# Patient Record
Sex: Male | Born: 1945 | Race: White | Hispanic: No | Smoking: Former smoker
Health system: Southern US, Community
[De-identification: ages and names within clinical notes are randomized; demographics above are authoritative.]

## PROBLEM LIST (undated history)

## (undated) DIAGNOSIS — K219 Gastro-esophageal reflux disease without esophagitis: Secondary | ICD-10-CM

## (undated) DIAGNOSIS — R200 Anesthesia of skin: Secondary | ICD-10-CM

## (undated) DIAGNOSIS — M1990 Unspecified osteoarthritis, unspecified site: Secondary | ICD-10-CM

## (undated) DIAGNOSIS — R202 Paresthesia of skin: Secondary | ICD-10-CM

## (undated) DIAGNOSIS — E785 Hyperlipidemia, unspecified: Secondary | ICD-10-CM

## (undated) DIAGNOSIS — E039 Hypothyroidism, unspecified: Secondary | ICD-10-CM

## (undated) DIAGNOSIS — I10 Essential (primary) hypertension: Secondary | ICD-10-CM

## (undated) DIAGNOSIS — K635 Polyp of colon: Secondary | ICD-10-CM

## (undated) HISTORY — PX: FOOT SURGERY: SHX648

## (undated) HISTORY — DX: Paresthesia of skin: R20.0

## (undated) HISTORY — PX: COLONOSCOPY: SHX174

## (undated) HISTORY — DX: Paresthesia of skin: R20.2

## (undated) HISTORY — PX: CHOLECYSTECTOMY: SHX55

---

## 1998-05-15 ENCOUNTER — Ambulatory Visit (HOSPITAL_COMMUNITY): Admission: RE | Admit: 1998-05-15 | Discharge: 1998-05-15 | Payer: Self-pay | Admitting: Family Medicine

## 1998-11-11 ENCOUNTER — Ambulatory Visit (HOSPITAL_COMMUNITY): Admission: RE | Admit: 1998-11-11 | Discharge: 1998-11-11 | Payer: Self-pay | Admitting: Family Medicine

## 2001-01-04 ENCOUNTER — Ambulatory Visit (HOSPITAL_COMMUNITY): Admission: RE | Admit: 2001-01-04 | Discharge: 2001-01-04 | Payer: Self-pay | Admitting: Gastroenterology

## 2005-03-03 ENCOUNTER — Ambulatory Visit (HOSPITAL_COMMUNITY): Admission: RE | Admit: 2005-03-03 | Discharge: 2005-03-03 | Payer: Self-pay | Admitting: Orthopedic Surgery

## 2005-03-03 ENCOUNTER — Ambulatory Visit (HOSPITAL_BASED_OUTPATIENT_CLINIC_OR_DEPARTMENT_OTHER): Admission: RE | Admit: 2005-03-03 | Discharge: 2005-03-03 | Payer: Self-pay | Admitting: Orthopedic Surgery

## 2013-09-12 DIAGNOSIS — E039 Hypothyroidism, unspecified: Secondary | ICD-10-CM | POA: Diagnosis not present

## 2013-12-04 DIAGNOSIS — H25099 Other age-related incipient cataract, unspecified eye: Secondary | ICD-10-CM | POA: Diagnosis not present

## 2014-04-08 DIAGNOSIS — I10 Essential (primary) hypertension: Secondary | ICD-10-CM | POA: Diagnosis not present

## 2014-04-08 DIAGNOSIS — K219 Gastro-esophageal reflux disease without esophagitis: Secondary | ICD-10-CM | POA: Diagnosis not present

## 2014-04-08 DIAGNOSIS — E7800 Pure hypercholesterolemia, unspecified: Secondary | ICD-10-CM | POA: Diagnosis not present

## 2014-04-08 DIAGNOSIS — Z125 Encounter for screening for malignant neoplasm of prostate: Secondary | ICD-10-CM | POA: Diagnosis not present

## 2014-04-12 ENCOUNTER — Other Ambulatory Visit: Payer: Self-pay | Admitting: Internal Medicine

## 2014-04-12 DIAGNOSIS — R5383 Other fatigue: Secondary | ICD-10-CM | POA: Diagnosis not present

## 2014-04-12 DIAGNOSIS — I10 Essential (primary) hypertension: Secondary | ICD-10-CM | POA: Diagnosis not present

## 2014-04-12 DIAGNOSIS — I6529 Occlusion and stenosis of unspecified carotid artery: Secondary | ICD-10-CM | POA: Diagnosis not present

## 2014-04-12 DIAGNOSIS — Z800 Family history of malignant neoplasm of digestive organs: Secondary | ICD-10-CM | POA: Diagnosis not present

## 2014-04-12 DIAGNOSIS — R5381 Other malaise: Secondary | ICD-10-CM | POA: Diagnosis not present

## 2014-04-12 DIAGNOSIS — Z23 Encounter for immunization: Secondary | ICD-10-CM | POA: Diagnosis not present

## 2014-04-17 ENCOUNTER — Ambulatory Visit
Admission: RE | Admit: 2014-04-17 | Discharge: 2014-04-17 | Disposition: A | Discharge status: Home / Self Care | Payer: Medicare Other | Source: Ambulatory Visit | Attending: Internal Medicine | Admitting: Internal Medicine

## 2014-04-17 DIAGNOSIS — I6529 Occlusion and stenosis of unspecified carotid artery: Secondary | ICD-10-CM | POA: Diagnosis not present

## 2014-04-17 DIAGNOSIS — I658 Occlusion and stenosis of other precerebral arteries: Secondary | ICD-10-CM | POA: Diagnosis not present

## 2014-05-21 DIAGNOSIS — G4733 Obstructive sleep apnea (adult) (pediatric): Secondary | ICD-10-CM | POA: Diagnosis not present

## 2014-05-21 DIAGNOSIS — R5381 Other malaise: Secondary | ICD-10-CM | POA: Diagnosis not present

## 2014-05-21 DIAGNOSIS — R5383 Other fatigue: Secondary | ICD-10-CM | POA: Diagnosis not present

## 2014-05-21 DIAGNOSIS — I10 Essential (primary) hypertension: Secondary | ICD-10-CM | POA: Diagnosis not present

## 2014-07-02 DIAGNOSIS — D124 Benign neoplasm of descending colon: Secondary | ICD-10-CM | POA: Diagnosis not present

## 2014-07-02 DIAGNOSIS — K641 Second degree hemorrhoids: Secondary | ICD-10-CM | POA: Diagnosis not present

## 2014-07-02 DIAGNOSIS — Z86010 Personal history of colonic polyps: Secondary | ICD-10-CM | POA: Diagnosis not present

## 2014-07-31 DIAGNOSIS — Z2089 Contact with and (suspected) exposure to other communicable diseases: Secondary | ICD-10-CM | POA: Diagnosis not present

## 2014-07-31 DIAGNOSIS — K641 Second degree hemorrhoids: Secondary | ICD-10-CM | POA: Diagnosis not present

## 2014-08-14 DIAGNOSIS — K641 Second degree hemorrhoids: Secondary | ICD-10-CM | POA: Diagnosis not present

## 2014-08-28 DIAGNOSIS — K641 Second degree hemorrhoids: Secondary | ICD-10-CM | POA: Diagnosis not present

## 2015-01-07 DIAGNOSIS — I10 Essential (primary) hypertension: Secondary | ICD-10-CM | POA: Diagnosis not present

## 2015-01-07 DIAGNOSIS — H25099 Other age-related incipient cataract, unspecified eye: Secondary | ICD-10-CM | POA: Diagnosis not present

## 2015-05-22 DIAGNOSIS — E780 Pure hypercholesterolemia: Secondary | ICD-10-CM | POA: Diagnosis not present

## 2015-05-22 DIAGNOSIS — I10 Essential (primary) hypertension: Secondary | ICD-10-CM | POA: Diagnosis not present

## 2015-05-22 DIAGNOSIS — Z125 Encounter for screening for malignant neoplasm of prostate: Secondary | ICD-10-CM | POA: Diagnosis not present

## 2015-05-26 DIAGNOSIS — Z1212 Encounter for screening for malignant neoplasm of rectum: Secondary | ICD-10-CM | POA: Diagnosis not present

## 2015-05-26 DIAGNOSIS — Z7982 Long term (current) use of aspirin: Secondary | ICD-10-CM | POA: Diagnosis not present

## 2015-05-26 DIAGNOSIS — I6529 Occlusion and stenosis of unspecified carotid artery: Secondary | ICD-10-CM | POA: Diagnosis not present

## 2015-05-26 DIAGNOSIS — I10 Essential (primary) hypertension: Secondary | ICD-10-CM | POA: Diagnosis not present

## 2015-05-26 DIAGNOSIS — E780 Pure hypercholesterolemia: Secondary | ICD-10-CM | POA: Diagnosis not present

## 2015-05-26 DIAGNOSIS — R740 Nonspecific elevation of levels of transaminase and lactic acid dehydrogenase [LDH]: Secondary | ICD-10-CM | POA: Diagnosis not present

## 2015-05-30 DIAGNOSIS — L723 Sebaceous cyst: Secondary | ICD-10-CM | POA: Diagnosis not present

## 2015-07-03 DIAGNOSIS — L723 Sebaceous cyst: Secondary | ICD-10-CM | POA: Diagnosis not present

## 2015-07-11 DIAGNOSIS — Z23 Encounter for immunization: Secondary | ICD-10-CM | POA: Diagnosis not present

## 2015-08-02 DIAGNOSIS — M25562 Pain in left knee: Secondary | ICD-10-CM | POA: Diagnosis not present

## 2016-02-06 DIAGNOSIS — H52223 Regular astigmatism, bilateral: Secondary | ICD-10-CM | POA: Diagnosis not present

## 2016-02-06 DIAGNOSIS — I10 Essential (primary) hypertension: Secondary | ICD-10-CM | POA: Diagnosis not present

## 2016-02-06 DIAGNOSIS — H5203 Hypermetropia, bilateral: Secondary | ICD-10-CM | POA: Diagnosis not present

## 2016-02-06 DIAGNOSIS — H524 Presbyopia: Secondary | ICD-10-CM | POA: Diagnosis not present

## 2016-02-06 DIAGNOSIS — H25099 Other age-related incipient cataract, unspecified eye: Secondary | ICD-10-CM | POA: Diagnosis not present

## 2016-05-20 DIAGNOSIS — Z125 Encounter for screening for malignant neoplasm of prostate: Secondary | ICD-10-CM | POA: Diagnosis not present

## 2016-05-20 DIAGNOSIS — Z0000 Encounter for general adult medical examination without abnormal findings: Secondary | ICD-10-CM | POA: Diagnosis not present

## 2016-05-20 DIAGNOSIS — Z23 Encounter for immunization: Secondary | ICD-10-CM | POA: Diagnosis not present

## 2016-05-20 DIAGNOSIS — E039 Hypothyroidism, unspecified: Secondary | ICD-10-CM | POA: Diagnosis not present

## 2016-05-20 DIAGNOSIS — I10 Essential (primary) hypertension: Secondary | ICD-10-CM | POA: Diagnosis not present

## 2016-05-20 DIAGNOSIS — Z7982 Long term (current) use of aspirin: Secondary | ICD-10-CM | POA: Diagnosis not present

## 2016-05-20 DIAGNOSIS — E7800 Pure hypercholesterolemia, unspecified: Secondary | ICD-10-CM | POA: Diagnosis not present

## 2016-05-21 ENCOUNTER — Other Ambulatory Visit: Payer: Self-pay

## 2016-05-27 DIAGNOSIS — Z800 Family history of malignant neoplasm of digestive organs: Secondary | ICD-10-CM | POA: Diagnosis not present

## 2016-05-27 DIAGNOSIS — E039 Hypothyroidism, unspecified: Secondary | ICD-10-CM | POA: Diagnosis not present

## 2016-05-27 DIAGNOSIS — Z833 Family history of diabetes mellitus: Secondary | ICD-10-CM | POA: Diagnosis not present

## 2016-05-27 DIAGNOSIS — K219 Gastro-esophageal reflux disease without esophagitis: Secondary | ICD-10-CM | POA: Diagnosis not present

## 2016-06-03 DIAGNOSIS — I6529 Occlusion and stenosis of unspecified carotid artery: Secondary | ICD-10-CM | POA: Diagnosis not present

## 2016-06-03 DIAGNOSIS — K219 Gastro-esophageal reflux disease without esophagitis: Secondary | ICD-10-CM | POA: Diagnosis not present

## 2016-06-03 DIAGNOSIS — I10 Essential (primary) hypertension: Secondary | ICD-10-CM | POA: Diagnosis not present

## 2016-06-03 DIAGNOSIS — E039 Hypothyroidism, unspecified: Secondary | ICD-10-CM | POA: Diagnosis not present

## 2016-06-03 DIAGNOSIS — E668 Other obesity: Secondary | ICD-10-CM | POA: Diagnosis not present

## 2016-06-03 DIAGNOSIS — E785 Hyperlipidemia, unspecified: Secondary | ICD-10-CM | POA: Diagnosis not present

## 2016-06-03 DIAGNOSIS — R9431 Abnormal electrocardiogram [ECG] [EKG]: Secondary | ICD-10-CM | POA: Diagnosis not present

## 2016-06-28 DIAGNOSIS — R9431 Abnormal electrocardiogram [ECG] [EKG]: Secondary | ICD-10-CM | POA: Diagnosis not present

## 2016-07-08 DIAGNOSIS — M19171 Post-traumatic osteoarthritis, right ankle and foot: Secondary | ICD-10-CM | POA: Diagnosis not present

## 2016-07-08 DIAGNOSIS — M25571 Pain in right ankle and joints of right foot: Secondary | ICD-10-CM | POA: Diagnosis not present

## 2016-07-23 DIAGNOSIS — M25571 Pain in right ankle and joints of right foot: Secondary | ICD-10-CM | POA: Diagnosis not present

## 2016-07-23 DIAGNOSIS — M19171 Post-traumatic osteoarthritis, right ankle and foot: Secondary | ICD-10-CM | POA: Diagnosis not present

## 2016-07-23 DIAGNOSIS — Z969 Presence of functional implant, unspecified: Secondary | ICD-10-CM | POA: Diagnosis not present

## 2016-07-28 ENCOUNTER — Other Ambulatory Visit: Payer: Self-pay | Admitting: Orthopedic Surgery

## 2016-07-28 DIAGNOSIS — M19171 Post-traumatic osteoarthritis, right ankle and foot: Secondary | ICD-10-CM

## 2016-07-30 ENCOUNTER — Ambulatory Visit
Admission: RE | Admit: 2016-07-30 | Discharge: 2016-07-30 | Disposition: A | Discharge status: Home / Self Care | Payer: Medicare Other | Source: Ambulatory Visit | Attending: Orthopedic Surgery | Admitting: Orthopedic Surgery

## 2016-07-30 DIAGNOSIS — M19071 Primary osteoarthritis, right ankle and foot: Secondary | ICD-10-CM | POA: Diagnosis not present

## 2016-07-30 DIAGNOSIS — M19171 Post-traumatic osteoarthritis, right ankle and foot: Secondary | ICD-10-CM

## 2016-07-30 NOTE — Discharge Instructions (Signed)
Ankle Fusion Ankle fusion surgery joins (fuses) ankle and leg bones together. The surgery uses devices such as screws, plates, rods, pins, and sometimes bone graft material. This is usually done to repair damage to the ankle due to arthritis, injury, or infection. It is also done to repair other ankle and foot conditions that cause pain. LET YOUR CAREGIVER KNOW ABOUT:   Allergies to food or medicine.  Medicines taken, including vitamins, herbs, eyedrops, over-the-counter medicines, and creams.  Use of steroids (by mouth or creams).  Previous problems with anesthetics or numbing medicines.  History of bleeding problems or blood clots.  Previous surgery.  Other health problems, including diabetes and kidney problems.  Possibility of pregnancy, if this applies. RISKS AND COMPLICATIONS  As with any surgery, complications may occur. However, they can usually be managed by your caregiver. General surgical complications may include:  Reaction to anesthesia.  Damage to surrounding nerves, tissues, or blood vessels.  Infection.  Bleeding.  Scarring.  Blood clot. With appropriate treatment and rehabilitation, the following complications are very uncommon:  Failure to heal (nonunion).  Healing in a poor position (malunion).  Stiff ankle or loss of mobility. BEFORE THE PROCEDURE  Follow your caregiver's instructions prior to your surgery to avoid complications. A physical exam and X-rays may be performed as well. You may be asked to:   Stop taking certain medicines for several days prior to your surgery, such as blood thinners (anticoagulants).  Avoid eating and drinking for at least 8 hours before the surgery. This will help you avoid complications from anesthesia.  Quit smoking, if this applies. Smoking increases the chances of a healing problem after your surgery. If you are thinking about quitting, ask your surgeon how long before the surgery you should stop smoking. Ask your  primary caregiver about approaches to help you stop. This can include medicines.  Arrange for a ride home after your surgery. Arrange for someone to help you with activities during recovery. PROCEDURE  The surgery is done after you are given medicine that makes you sleep (general anesthetic) or medicine that makes you numb from the waist down (spinal anesthetic). You will be asleep and will not feel any pain. The surgery can be performed with open surgery or minimally invasive surgery using a thin, lighted tube inserted through a small cut (arthroscopy). There are several variations of ankle fusion, but the basic surgery involves the following: Open Surgery Cuts (incisions) are made on each side of the ankle to allow the surgeon to access the joint. Once the joint is opened, cartilage and bone surfaces may be removed and reshaped if needed. The joint is repositioned in the proper place and joined together with a device, such as screws or plates. The body's natural healing process will help fuse together newly formed surfaces within the ankle. Though less common, sometimes pins within the ankle joint are attached to outside rods (external fixator) to aid in the healing process. Arthroscopic Surgery Small keyhole incisions are made on either side of the ankle. The surgeon will insert small instruments into the ankle joint. A tiny camera allows the surgeon to view inside the ankle. Small tools are used to resurface the cartilage area and bones of the ankle. Screws are placed through the skin and ankle to help it stay in position while it heals. AFTER THE PROCEDURE   You will likely be in the hospital for 1 to 2 days following surgery.  Caregivers will help you to manage pain and swelling  with medicines, ice, and raising (elevation) of the ankle.  Full recovery will take several months, but you may be able to walk gently in about 12 weeks. Crutches will help you move around when you first return  home.  A cast or splint will be applied to the lower leg. You will learn how to walk with crutches.  Follow your caregiver's instructions for home care after surgery. FOR MORE INFORMATION  [employer] Academy [employer]: www.aaos.org   This information is not intended to replace advice given to you by your health care provider. Make sure you discuss any questions you have with your health care provider.   Document Released: 03/03/2010 Document Revised: 12/06/2011 Document Reviewed: 03/03/2010 Elsevier Interactive Patient Education [employer].

## 2016-09-06 DIAGNOSIS — Z969 Presence of functional implant, unspecified: Secondary | ICD-10-CM | POA: Diagnosis not present

## 2016-09-06 DIAGNOSIS — M19171 Post-traumatic osteoarthritis, right ankle and foot: Secondary | ICD-10-CM | POA: Diagnosis not present

## 2016-09-06 DIAGNOSIS — M25571 Pain in right ankle and joints of right foot: Secondary | ICD-10-CM | POA: Diagnosis not present

## 2016-09-10 ENCOUNTER — Other Ambulatory Visit: Payer: Self-pay | Admitting: Orthopedic Surgery

## 2016-10-14 ENCOUNTER — Encounter (HOSPITAL_COMMUNITY): Payer: Self-pay

## 2016-10-14 ENCOUNTER — Encounter (HOSPITAL_COMMUNITY)
Admission: RE | Admit: 2016-10-14 | Discharge: 2016-10-14 | Disposition: A | Discharge status: Home / Self Care | Payer: Medicare Other | Source: Ambulatory Visit | Attending: Orthopedic Surgery | Admitting: Orthopedic Surgery

## 2016-10-14 DIAGNOSIS — Z01812 Encounter for preprocedural laboratory examination: Secondary | ICD-10-CM | POA: Insufficient documentation

## 2016-10-14 HISTORY — DX: Hyperlipidemia, unspecified: E78.5

## 2016-10-14 HISTORY — DX: Unspecified osteoarthritis, unspecified site: M19.90

## 2016-10-14 HISTORY — DX: Essential (primary) hypertension: I10

## 2016-10-14 HISTORY — DX: Hypothyroidism, unspecified: E03.9

## 2016-10-14 HISTORY — DX: Polyp of colon: K63.5

## 2016-10-14 HISTORY — DX: Gastro-esophageal reflux disease without esophagitis: K21.9

## 2016-10-14 LAB — CBC
HCT: 40.6 % (ref 39.0–52.0)
HEMOGLOBIN: 13.3 g/dL (ref 13.0–17.0)
MCH: 31.7 pg (ref 26.0–34.0)
MCHC: 32.8 g/dL (ref 30.0–36.0)
MCV: 96.7 fL (ref 78.0–100.0)
Platelets: 252 10*3/uL (ref 150–400)
RBC: 4.2 MIL/uL — AB (ref 4.22–5.81)
RDW: 14.1 % (ref 11.5–15.5)
WBC: 12.8 10*3/uL — AB (ref 4.0–10.5)

## 2016-10-14 LAB — BASIC METABOLIC PANEL
ANION GAP: 10 (ref 5–15)
BUN: 19 mg/dL (ref 6–20)
CHLORIDE: 102 mmol/L (ref 101–111)
CO2: 25 mmol/L (ref 22–32)
Calcium: 9.5 mg/dL (ref 8.9–10.3)
Creatinine, Ser: 0.91 mg/dL (ref 0.61–1.24)
GFR calc non Af Amer: >60 mL/min (ref >60)
Glucose, Bld: 98 mg/dL (ref 65–99)
Potassium: 3.6 mmol/L (ref 3.5–5.1)
SODIUM: 137 mmol/L (ref 135–145)

## 2016-10-14 LAB — SURGICAL PCR SCREEN
MRSA, PCR: NEGATIVE
Staphylococcus aureus: NEGATIVE

## 2016-10-14 NOTE — Pre-Procedure Instructions (Signed)
    [place]  10/14/2016    Your procedure is scheduled on Thursday, January 25.  Report to [facility] Admitting at 5:30 AM                Your surgery or procedure is scheduled for 7:15 AM   Call this number if you have problems the morning of surgery:[phone]                For any other questions, please call [phone], Monday - Friday 8 AM - 4 PM.   Remember:  Do not eat food or drink liquids after midnight Wednesday, January 24.  Take these medicines the morning of surgery with A SIP OF WATER: amLODipine (NORVASC), atorvastatin (LIPITOR), ezetimibe (ZETIA),  levothyroxine (SYNTHROID, LEVOTHROID).               1 Week prior to surgery STOP taking Aspirin, Aspirin Products (Goody Powder, Excedrin Migraine), Ibuprofen (Advil), Naproxen (Aleve), Vitamins and Herbal Products (ie Fish Oil)   Do not wear jewelry, make-up or nail polish.  Do not wear lotions, powders, or perfumes, or deodorant.   Men may shave face and neck.  Do not bring valuables to the hospital.  [facility] is not responsible for any belongings or valuables.  Contacts, dentures or bridgework may not be worn into surgery.  Leave your suitcase in the car.  After surgery it may be brought to your room.  For patients admitted to the hospital, discharge time will be determined by your treatment team.  Special instructions: Review  [place] - Preparing For Surgery.  Please read over the following fact sheets that you were given. [place]- Preparing For Surgery and Patient Instructions for Mupirocin Application, Incentive Spirometry, Pain Booklet

## 2016-10-14 NOTE — Progress Notes (Signed)
Francis Brewer denies chest pain or palpations. Patient reports that he  had an EKG at PCP office, Dr Shelia Media at [facility], [place].  Patient states that he had a stress test and echo at a cardiologist that Dr Shelia Media sent him to, but does not know his name.  Patient reports that he was told that everything was normal.  I requested records from Dr Shelia Media.

## 2016-10-18 ENCOUNTER — Encounter (HOSPITAL_COMMUNITY): Payer: Self-pay

## 2016-10-18 NOTE — Progress Notes (Signed)
Anesthesia Chart Review: Patient is a 71 year old male scheduled for right total ankle arthroplasty, hardware removal, achilles tendon lengthening on 10/21/16 by Dr. Doran Durand. Case is posted for general anesthesia.   History includes former smoker, HLD, HTN, GERD, hypothyroidism, cholecystectomy, foot surgery. BMI is consistent with obesity.   PCP is Dr. Deland Pretty. He was sent to cardiologist Dr. Adalberto Ill in 06/03/16 for evaluation of an abnormal EKG (LAFB, poor r wave progression). Patient was asymptomatic, so stress test was not felt warranted. He did order an echo (see below) due to finding of murmur.    Meds include amlodipine, Lipitor, Zetia, levothyroxine, fish oil, Prilosec, Diovan-HCT.  BP 123/61   Pulse 88   Temp 36.3 C   Resp 20   Ht 5' 7.5" (1.715 m)   Wt 204 lb 1.6 oz (92.6 kg)   SpO2 99%   BMI 31.49 kg/m   EKG 05/27/16 [facility]): SB at 58 bpm, poor r wave progression, consider anterior infarct (new, by Dr. Shelia Media notes). 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   Echo 06/28/16 (Dr. Wynonia Lawman): Conclusion: 1. Mild concentric LVH with normal global wall motion. Estimated EF 55%. 2. 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 Mild AV leaflet calcification. AV peak vel 1.79 m/s. AV pk grad 12.8 mmHg, AV mean grad 7.1 mmHg, AV VTI 44.0 cm, AVA Vmax 1.58 cm2. 3. Trace MR. E-wave dominant mitral inflow. 4. Trace TR and PR.  Carotid U/S 04/17/14: IMPRESSION: Minor carotid atherosclerosis. No hemodynamically significant ICA stenosis by ultrasound. Degree of narrowing less than 50% Bilaterally.  Preoperative labs noted.   If no acute changes then I anticipate that he can proceed as planned.   George Hugh [facility] Short Stay Center/Anesthesiology Phone [phone] 10/18/2016 4:09 PM

## 2016-10-21 ENCOUNTER — Inpatient Hospital Stay (HOSPITAL_COMMUNITY): Payer: Medicare Other | Admitting: Anesthesiology

## 2016-10-21 ENCOUNTER — Inpatient Hospital Stay (HOSPITAL_COMMUNITY)
Admission: RE | Admit: 2016-10-21 | Discharge: 2016-10-23 | DRG: 469 | Disposition: A | Discharge status: Home / Self Care | Payer: Medicare Other | Source: Ambulatory Visit | Attending: Orthopedic Surgery | Admitting: Orthopedic Surgery

## 2016-10-21 ENCOUNTER — Inpatient Hospital Stay (HOSPITAL_COMMUNITY): Payer: Medicare Other

## 2016-10-21 ENCOUNTER — Encounter (HOSPITAL_COMMUNITY): Payer: Self-pay | Admitting: Urology

## 2016-10-21 ENCOUNTER — Inpatient Hospital Stay (HOSPITAL_COMMUNITY): Payer: Medicare Other | Admitting: Vascular Surgery

## 2016-10-21 ENCOUNTER — Encounter (HOSPITAL_COMMUNITY)
Admission: RE | Disposition: A | Discharge status: Home / Self Care | Payer: Self-pay | Source: Ambulatory Visit | Attending: Orthopedic Surgery

## 2016-10-21 DIAGNOSIS — T8484XA Pain due to internal orthopedic prosthetic devices, implants and grafts, initial encounter: Secondary | ICD-10-CM | POA: Diagnosis not present

## 2016-10-21 DIAGNOSIS — E039 Hypothyroidism, unspecified: Secondary | ICD-10-CM | POA: Diagnosis present

## 2016-10-21 DIAGNOSIS — Z87891 Personal history of nicotine dependence: Secondary | ICD-10-CM | POA: Diagnosis not present

## 2016-10-21 DIAGNOSIS — Z96661 Presence of right artificial ankle joint: Secondary | ICD-10-CM

## 2016-10-21 DIAGNOSIS — M19171 Post-traumatic osteoarthritis, right ankle and foot: Secondary | ICD-10-CM | POA: Diagnosis not present

## 2016-10-21 DIAGNOSIS — Z79899 Other long term (current) drug therapy: Secondary | ICD-10-CM

## 2016-10-21 DIAGNOSIS — Z86010 Personal history of colonic polyps: Secondary | ICD-10-CM

## 2016-10-21 DIAGNOSIS — E785 Hyperlipidemia, unspecified: Secondary | ICD-10-CM | POA: Diagnosis present

## 2016-10-21 DIAGNOSIS — Z471 Aftercare following joint replacement surgery: Secondary | ICD-10-CM | POA: Diagnosis not present

## 2016-10-21 DIAGNOSIS — M12571 Traumatic arthropathy, right ankle and foot: Principal | ICD-10-CM | POA: Diagnosis present

## 2016-10-21 DIAGNOSIS — M19071 Primary osteoarthritis, right ankle and foot: Secondary | ICD-10-CM | POA: Diagnosis not present

## 2016-10-21 DIAGNOSIS — R262 Difficulty in walking, not elsewhere classified: Secondary | ICD-10-CM

## 2016-10-21 DIAGNOSIS — M6701 Short Achilles tendon (acquired), right ankle: Secondary | ICD-10-CM | POA: Diagnosis not present

## 2016-10-21 DIAGNOSIS — G8918 Other acute postprocedural pain: Secondary | ICD-10-CM | POA: Diagnosis not present

## 2016-10-21 DIAGNOSIS — Z9049 Acquired absence of other specified parts of digestive tract: Secondary | ICD-10-CM | POA: Diagnosis not present

## 2016-10-21 DIAGNOSIS — Z419 Encounter for procedure for purposes other than remedying health state, unspecified: Secondary | ICD-10-CM

## 2016-10-21 DIAGNOSIS — I10 Essential (primary) hypertension: Secondary | ICD-10-CM | POA: Diagnosis present

## 2016-10-21 DIAGNOSIS — K219 Gastro-esophageal reflux disease without esophagitis: Secondary | ICD-10-CM | POA: Diagnosis present

## 2016-10-21 DIAGNOSIS — Z9889 Other specified postprocedural states: Secondary | ICD-10-CM | POA: Diagnosis not present

## 2016-10-21 HISTORY — PX: ACHILLES TENDON LENGTHENING: SHX6455

## 2016-10-21 HISTORY — PX: TOTAL ANKLE ARTHROPLASTY: SHX811

## 2016-10-21 HISTORY — PX: HARDWARE REMOVAL: SHX979

## 2016-10-21 SURGERY — ARTHROPLASTY, ANKLE, TOTAL
Anesthesia: General | Laterality: Right

## 2016-10-21 MED ORDER — LEVOTHYROXINE SODIUM 75 MCG PO TABS
75.0000 ug | ORAL_TABLET | Freq: Every day | ORAL | Status: DC
  Administered 2016-10-22 – 2016-10-23 (×2): 75 ug via ORAL
  Filled 2016-10-21 (×2): qty 1

## 2016-10-21 MED ORDER — ENOXAPARIN SODIUM 40 MG/0.4ML ~~LOC~~ SOLN
40.0000 mg | SUBCUTANEOUS | Status: DC
  Administered 2016-10-22 – 2016-10-23 (×2): 40 mg via SUBCUTANEOUS
  Filled 2016-10-21 (×2): qty 0.4

## 2016-10-21 MED ORDER — HYDROCHLOROTHIAZIDE 25 MG PO TABS
25.0000 mg | ORAL_TABLET | Freq: Every day | ORAL | Status: DC
  Administered 2016-10-22 – 2016-10-23 (×2): 25 mg via ORAL
  Filled 2016-10-21 (×2): qty 1

## 2016-10-21 MED ORDER — MORPHINE SULFATE (PF) 2 MG/ML IV SOLN: 2.0000 mg | INTRAVENOUS | Status: DC | PRN

## 2016-10-21 MED ORDER — ACETAMINOPHEN 650 MG RE SUPP: 650.0000 mg | Freq: Four times a day (QID) | RECTAL | Status: DC | PRN

## 2016-10-21 MED ORDER — METOCLOPRAMIDE HCL 5 MG PO TABS
5.0000 mg | ORAL_TABLET | Freq: Three times a day (TID) | ORAL | Status: DC | PRN
Start: 2016-10-21 — End: 2016-10-23

## 2016-10-21 MED ORDER — FENTANYL CITRATE (PF) 100 MCG/2ML IJ SOLN
INTRAMUSCULAR | Status: DC | PRN
  Administered 2016-10-21: 50 ug via INTRAVENOUS
  Administered 2016-10-21: 100 ug via INTRAVENOUS
  Administered 2016-10-21: 50 ug via INTRAVENOUS

## 2016-10-21 MED ORDER — ONDANSETRON HCL 4 MG/2ML IJ SOLN
INTRAMUSCULAR | Status: DC | PRN
Start: 2016-10-21 — End: 2016-10-21
  Administered 2016-10-21: 4 mg via INTRAVENOUS

## 2016-10-21 MED ORDER — PHENYLEPHRINE HCL 10 MG/ML IJ SOLN
INTRAMUSCULAR | Status: DC | PRN
  Administered 2016-10-21 (×3): 80 ug via INTRAVENOUS

## 2016-10-21 MED ORDER — IRBESARTAN 300 MG PO TABS
300.0000 mg | ORAL_TABLET | Freq: Every day | ORAL | Status: DC
  Administered 2016-10-22 – 2016-10-23 (×2): 300 mg via ORAL
  Filled 2016-10-21 (×2): qty 1

## 2016-10-21 MED ORDER — ONDANSETRON HCL 4 MG PO TABS: 4.0000 mg | ORAL_TABLET | Freq: Four times a day (QID) | ORAL | Status: DC | PRN

## 2016-10-21 MED ORDER — CEFAZOLIN SODIUM-DEXTROSE 2-4 GM/100ML-% IV SOLN
2.0000 g | INTRAVENOUS | Status: AC
  Administered 2016-10-21: 2 g via INTRAVENOUS
  Filled 2016-10-21: qty 100

## 2016-10-21 MED ORDER — FENTANYL CITRATE (PF) 100 MCG/2ML IJ SOLN
INTRAMUSCULAR | Status: AC
  Filled 2016-10-21: qty 2

## 2016-10-21 MED ORDER — VANCOMYCIN HCL 500 MG IV SOLR
INTRAVENOUS | Status: DC | PRN
  Administered 2016-10-21: 500 mg via TOPICAL

## 2016-10-21 MED ORDER — DIPHENHYDRAMINE HCL 12.5 MG/5ML PO ELIX: 12.5000 mg | ORAL_SOLUTION | ORAL | Status: DC | PRN

## 2016-10-21 MED ORDER — SODIUM CHLORIDE 0.9 % IV SOLN: INTRAVENOUS | Status: DC

## 2016-10-21 MED ORDER — ATORVASTATIN CALCIUM 40 MG PO TABS
40.0000 mg | ORAL_TABLET | Freq: Every day | ORAL | Status: DC
  Administered 2016-10-22 – 2016-10-23 (×2): 40 mg via ORAL
  Filled 2016-10-21 (×2): qty 1

## 2016-10-21 MED ORDER — PANTOPRAZOLE SODIUM 40 MG PO TBEC
40.0000 mg | DELAYED_RELEASE_TABLET | Freq: Every day | ORAL | Status: DC
  Administered 2016-10-21 – 2016-10-23 (×3): 40 mg via ORAL
  Filled 2016-10-21 (×3): qty 1

## 2016-10-21 MED ORDER — EZETIMIBE 10 MG PO TABS
10.0000 mg | ORAL_TABLET | Freq: Every day | ORAL | Status: DC
  Administered 2016-10-22 – 2016-10-23 (×2): 10 mg via ORAL
  Filled 2016-10-21 (×2): qty 1

## 2016-10-21 MED ORDER — PROPOFOL 10 MG/ML IV BOLUS
INTRAVENOUS | Status: DC | PRN
  Administered 2016-10-21: 150 mg via INTRAVENOUS
  Administered 2016-10-21: 50 mg via INTRAVENOUS

## 2016-10-21 MED ORDER — MIDAZOLAM HCL 5 MG/5ML IJ SOLN
INTRAMUSCULAR | Status: DC | PRN
  Administered 2016-10-21 (×2): .5 mg via INTRAVENOUS

## 2016-10-21 MED ORDER — BUPIVACAINE-EPINEPHRINE (PF) 0.5% -1:200000 IJ SOLN
INTRAMUSCULAR | Status: DC | PRN
  Administered 2016-10-21: 30 mL via PERINEURAL

## 2016-10-21 MED ORDER — LACTATED RINGERS IV SOLN
INTRAVENOUS | Status: DC | PRN
  Administered 2016-10-21 (×2): via INTRAVENOUS

## 2016-10-21 MED ORDER — SUGAMMADEX SODIUM 200 MG/2ML IV SOLN
INTRAVENOUS | Status: AC
  Filled 2016-10-21: qty 2

## 2016-10-21 MED ORDER — DOCUSATE SODIUM 100 MG PO CAPS
100.0000 mg | ORAL_CAPSULE | Freq: Two times a day (BID) | ORAL | Status: DC
  Administered 2016-10-21 – 2016-10-23 (×4): 100 mg via ORAL
  Filled 2016-10-21 (×4): qty 1

## 2016-10-21 MED ORDER — ADULT MULTIVITAMIN W/MINERALS CH
1.0000 | ORAL_TABLET | Freq: Every day | ORAL | Status: DC
  Administered 2016-10-22 – 2016-10-23 (×2): 1 via ORAL
  Filled 2016-10-21 (×2): qty 1

## 2016-10-21 MED ORDER — DEXAMETHASONE SODIUM PHOSPHATE 10 MG/ML IJ SOLN
INTRAMUSCULAR | Status: DC | PRN
  Administered 2016-10-21: 5 mg via INTRAVENOUS

## 2016-10-21 MED ORDER — MIDAZOLAM HCL 2 MG/2ML IJ SOLN
INTRAMUSCULAR | Status: AC
  Filled 2016-10-21: qty 2

## 2016-10-21 MED ORDER — SODIUM CHLORIDE 0.9 % IV SOLN
INTRAVENOUS | Status: DC
  Administered 2016-10-21: 75 mL/h via INTRAVENOUS

## 2016-10-21 MED ORDER — ROCURONIUM BROMIDE 100 MG/10ML IV SOLN
INTRAVENOUS | Status: DC | PRN
  Administered 2016-10-21: 10 mg via INTRAVENOUS
  Administered 2016-10-21: 40 mg via INTRAVENOUS

## 2016-10-21 MED ORDER — ONDANSETRON HCL 4 MG/2ML IJ SOLN: 4.0000 mg | Freq: Four times a day (QID) | INTRAMUSCULAR | Status: DC | PRN

## 2016-10-21 MED ORDER — VALSARTAN-HYDROCHLOROTHIAZIDE 320-25 MG PO TABS: 1.0000 | ORAL_TABLET | Freq: Every day | ORAL | Status: DC

## 2016-10-21 MED ORDER — VANCOMYCIN HCL 500 MG IV SOLR
INTRAVENOUS | Status: AC
  Filled 2016-10-21: qty 500

## 2016-10-21 MED ORDER — SUGAMMADEX SODIUM 200 MG/2ML IV SOLN
INTRAVENOUS | Status: DC | PRN
  Administered 2016-10-21: 200 mg via INTRAVENOUS

## 2016-10-21 MED ORDER — AMLODIPINE BESYLATE 10 MG PO TABS
10.0000 mg | ORAL_TABLET | Freq: Every day | ORAL | Status: DC
  Administered 2016-10-22 – 2016-10-23 (×2): 10 mg via ORAL
  Filled 2016-10-21 (×2): qty 1

## 2016-10-21 MED ORDER — 0.9 % SODIUM CHLORIDE (POUR BTL) OPTIME
TOPICAL | Status: DC | PRN
  Administered 2016-10-21: 1000 mL

## 2016-10-21 MED ORDER — SENNA 8.6 MG PO TABS
1.0000 | ORAL_TABLET | Freq: Two times a day (BID) | ORAL | Status: DC
  Administered 2016-10-21 – 2016-10-23 (×4): 8.6 mg via ORAL
  Filled 2016-10-21 (×4): qty 1

## 2016-10-21 MED ORDER — CHLORHEXIDINE GLUCONATE 4 % EX LIQD: 60.0000 mL | Freq: Once | CUTANEOUS | Status: DC

## 2016-10-21 MED ORDER — PROPOFOL 10 MG/ML IV BOLUS
INTRAVENOUS | Status: AC
  Filled 2016-10-21: qty 20

## 2016-10-21 MED ORDER — METOCLOPRAMIDE HCL 5 MG/ML IJ SOLN
5.0000 mg | Freq: Three times a day (TID) | INTRAMUSCULAR | Status: DC | PRN
Start: 2016-10-21 — End: 2016-10-23

## 2016-10-21 MED ORDER — OXYCODONE HCL 5 MG PO TABS
5.0000 mg | ORAL_TABLET | ORAL | Status: DC | PRN
  Administered 2016-10-21 – 2016-10-23 (×4): 10 mg via ORAL
  Filled 2016-10-21 (×5): qty 2

## 2016-10-21 MED ORDER — CELECOXIB 200 MG PO CAPS
200.0000 mg | ORAL_CAPSULE | Freq: Two times a day (BID) | ORAL | Status: DC
  Administered 2016-10-21 – 2016-10-23 (×4): 200 mg via ORAL
  Filled 2016-10-21 (×4): qty 1

## 2016-10-21 MED ORDER — ACETAMINOPHEN 325 MG PO TABS
650.0000 mg | ORAL_TABLET | Freq: Four times a day (QID) | ORAL | Status: DC | PRN
  Administered 2016-10-22: 650 mg via ORAL
  Filled 2016-10-21: qty 2

## 2016-10-21 MED ORDER — LIDOCAINE HCL (CARDIAC) 20 MG/ML IV SOLN
INTRAVENOUS | Status: DC | PRN
  Administered 2016-10-21: 60 mg via INTRAVENOUS

## 2016-10-21 SURGICAL SUPPLY — 70 items
BANDAGE ESMARK 6X9 LF (GAUZE/BANDAGES/DRESSINGS) ×1 IMPLANT
BLADE OSC (BLADE) ×2 IMPLANT
BLADE RECIP (BLADE) ×2 IMPLANT
BLADE RECIPRO TAPERED (BLADE) ×3 IMPLANT
BLADE SURG 15 STRL LF DISP TIS (BLADE) ×2 IMPLANT
BLADE SURG 15 STRL SS (BLADE) ×6
BNDG CMPR 9X6 STRL LF SNTH (GAUZE/BANDAGES/DRESSINGS) ×1
BNDG COHESIVE 4X5 TAN STRL (GAUZE/BANDAGES/DRESSINGS) ×3 IMPLANT
BNDG COHESIVE 6X5 TAN STRL LF (GAUZE/BANDAGES/DRESSINGS) ×3 IMPLANT
BNDG ESMARK 6X9 LF (GAUZE/BANDAGES/DRESSINGS) ×3
CANISTER SUCT 3000ML PPV (MISCELLANEOUS) ×3 IMPLANT
CHLORAPREP W/TINT 26ML (MISCELLANEOUS) ×3 IMPLANT
CORE POLY SLIDING 8MM ANKLE (Ankle) ×2 IMPLANT
COVER SURGICAL LIGHT HANDLE (MISCELLANEOUS) ×3 IMPLANT
CUFF TOURNIQUET SINGLE 34IN LL (TOURNIQUET CUFF) ×3 IMPLANT
DISPOSABLES PACK SBI (PACKS) ×2 IMPLANT
DRAPE C-ARM 42X72 X-RAY (DRAPES) ×3 IMPLANT
DRAPE C-ARMOR (DRAPES) ×3 IMPLANT
DRAPE HALF SHEET 40X57 (DRAPES) ×3 IMPLANT
DRAPE U-SHAPE 47X51 STRL (DRAPES) ×3 IMPLANT
DRSG ADAPTIC 3X8 NADH LF (GAUZE/BANDAGES/DRESSINGS) IMPLANT
DRSG MEPILEX BORDER 4X4 (GAUZE/BANDAGES/DRESSINGS) ×3 IMPLANT
DRSG MEPITEL 3X4 ME34 (GAUZE/BANDAGES/DRESSINGS) ×2 IMPLANT
DRSG MEPITEL 4X7.2 (GAUZE/BANDAGES/DRESSINGS) ×3 IMPLANT
DRSG PAD ABDOMINAL 8X10 ST (GAUZE/BANDAGES/DRESSINGS) ×9 IMPLANT
ELECT REM PT RETURN 9FT ADLT (ELECTROSURGICAL) ×3
ELECTRODE REM PT RTRN 9FT ADLT (ELECTROSURGICAL) ×1 IMPLANT
GAUZE SPONGE 4X4 12PLY STRL (GAUZE/BANDAGES/DRESSINGS) ×6 IMPLANT
GLOVE BIO SURGEON STRL SZ8 (GLOVE) ×3 IMPLANT
GLOVE BIOGEL PI IND STRL 8 (GLOVE) ×2 IMPLANT
GLOVE BIOGEL PI INDICATOR 8 (GLOVE) ×4
GLOVE ECLIPSE 7.5 STRL STRAW (GLOVE) ×5 IMPLANT
GLOVE SURG SS PI 8.0 STRL IVOR (GLOVE) ×4 IMPLANT
GOWN STRL REUS W/ TWL LRG LVL3 (GOWN DISPOSABLE) ×1 IMPLANT
GOWN STRL REUS W/ TWL XL LVL3 (GOWN DISPOSABLE) ×2 IMPLANT
GOWN STRL REUS W/TWL LRG LVL3 (GOWN DISPOSABLE) ×3
GOWN STRL REUS W/TWL XL LVL3 (GOWN DISPOSABLE) ×6
IMPLANT TALAR STAR SZ M RT (Orthopedic Implant) ×2 IMPLANT
IMPLANT TIBIAL STAR SZ XL (Ankle) ×2 IMPLANT
KIT BASIN OR (CUSTOM PROCEDURE TRAY) ×3 IMPLANT
KIT ROOM TURNOVER OR (KITS) ×3 IMPLANT
NEEDLE 22X1 1/2 (OR ONLY) (NEEDLE) IMPLANT
NS IRRIG 1000ML POUR BTL (IV SOLUTION) ×3 IMPLANT
PACK ORTHO EXTREMITY (CUSTOM PROCEDURE TRAY) ×3 IMPLANT
PACK TOTAL JOINT (CUSTOM PROCEDURE TRAY) ×3 IMPLANT
PAD ABD 8X10 STRL (GAUZE/BANDAGES/DRESSINGS) ×2 IMPLANT
PAD ARMBOARD 7.5X6 YLW CONV (MISCELLANEOUS) ×4 IMPLANT
PAD CAST 4YDX4 CTTN HI CHSV (CAST SUPPLIES) ×3 IMPLANT
PADDING CAST COTTON 4X4 STRL (CAST SUPPLIES) ×3
PADDING CAST COTTON 6X4 STRL (CAST SUPPLIES) ×3 IMPLANT
SPONGE GAUZE 4X4 12PLY STER LF (GAUZE/BANDAGES/DRESSINGS) ×2 IMPLANT
SPONGE LAP 4X18 X RAY DECT (DISPOSABLE) ×1 IMPLANT
STAPLER VISISTAT 35W (STAPLE) IMPLANT
SUCTION FRAZIER HANDLE 10FR (MISCELLANEOUS) ×2
SUCTION TUBE FRAZIER 10FR DISP (MISCELLANEOUS) ×1 IMPLANT
SURGILUBE 2OZ TUBE FLIPTOP (MISCELLANEOUS) ×3 IMPLANT
SUT ETHILON 3 0 PS 1 (SUTURE) ×3 IMPLANT
SUT MNCRL AB 3-0 PS2 18 (SUTURE) ×5 IMPLANT
SUT PROLENE 3 0 PS 2 (SUTURE) ×1 IMPLANT
SUT VIC AB 0 CT1 27 (SUTURE) ×3
SUT VIC AB 0 CT1 27XBRD ANBCTR (SUTURE) ×1 IMPLANT
SUT VIC AB 2-0 CT1 27 (SUTURE) ×6
SUT VIC AB 2-0 CT1 TAPERPNT 27 (SUTURE) ×2 IMPLANT
SUT VIC AB 3-0 PS2 18 (SUTURE)
SUT VIC AB 3-0 PS2 18XBRD (SUTURE) ×1 IMPLANT
SYR CONTROL 10ML LL (SYRINGE) IMPLANT
TOWEL OR 17X24 6PK STRL BLUE (TOWEL DISPOSABLE) ×3 IMPLANT
TOWEL OR 17X26 10 PK STRL BLUE (TOWEL DISPOSABLE) ×3 IMPLANT
TUBE CONNECTING 12'X1/4 (SUCTIONS) ×1
TUBE CONNECTING 12X1/4 (SUCTIONS) ×2 IMPLANT

## 2016-10-21 NOTE — Anesthesia Postprocedure Evaluation (Addendum)
Anesthesia Post Note  Patient: Francis Brewer  Procedure(s) Performed: Procedure(s) (LRB): RIGHT TOTAL ANKLE ARTHOPLASTY (Right) HARDWARE REMOVAL (Right) ACHILLES TENDON LENGTHENING (Right)  Patient location during evaluation: PACU Anesthesia Type: General and Regional Level of consciousness: awake and alert Pain management: pain level controlled Vital Signs Assessment: post-procedure vital signs reviewed and stable Respiratory status: spontaneous breathing, nonlabored ventilation, respiratory function stable and patient connected to nasal cannula oxygen Cardiovascular status: blood pressure returned to baseline and stable Postop Assessment: no signs of nausea or vomiting Anesthetic complications: no       Last Vitals:  Vitals:   10/21/16 1030 10/21/16 1045  BP: 126/82 140/71  Pulse: 87 91  Resp: 15 (!) 23  Temp:      Last Pain:  Vitals:   10/21/16 1045  TempSrc:   PainSc: 0-No pain                 Malashia Kamaka,JAMES TERRILL

## 2016-10-21 NOTE — Brief Op Note (Signed)
10/21/2016  10:28 AM  PATIENT:  Francis Brewer  71 y.o. male  PRE-OPERATIVE DIAGNOSIS:   1.  Right ankle post-traumatic arthritis      2.  Right distal tibia retained hardware      3.  Right tight heelcord  POST-OPERATIVE DIAGNOSIS:  same   Procedure(s): 1.  Removal of deep implants right distal tibia 2.  RIGHT TOTAL ANKLE ARTHOPLASTY 3.  Right percutaneous ACHILLES TENDON LENGTHENING  SURGEON:  Wylene Simmer, MD  ASSISTANT:  Mechele Claude, PA-C  ANESTHESIA:   General, regional  EBL:  minimal   TOURNIQUET:   Total Tourniquet Time Documented: Thigh (Right) - 125 minutes Total: Thigh (Right) - 0000000 minutes  COMPLICATIONS:  None apparent  DISPOSITION:  Extubated, awake and stable to recovery.  DICTATION ID:  [id number]

## 2016-10-21 NOTE — Progress Notes (Signed)
Patient states he has had some weakness in his right hand, unable to grip all the way. Pt states this has been there for a few days and he thinks he has a pinched nerve. Note left on chart for anesthesia and surgeon. Patient states he will tell Dr. Doran Durand and Anesthesiologist this morning.

## 2016-10-21 NOTE — Anesthesia Procedure Notes (Signed)
Procedure Name: Intubation Date/Time: 10/21/2016 7:29 AM Performed by: Francis Brewer Pre-anesthesia Checklist: Patient identified, Emergency Drugs available, Suction available and Patient being monitored Patient Re-evaluated:Patient Re-evaluated prior to inductionOxygen Delivery Method: Circle System Utilized Preoxygenation: Pre-oxygenation with 100% oxygen Intubation Type: IV induction Ventilation: Mask ventilation without difficulty and Oral airway inserted - appropriate to patient size Laryngoscope Size: Mac and 4 Grade View: Grade II Tube type: Oral Tube size: 7.5 mm Number of attempts: 1 Airway Equipment and Method: Stylet and Oral airway Placement Confirmation: ETT inserted through vocal cords under direct vision,  positive ETCO2 and breath sounds checked- equal and bilateral Secured at: 22 cm Tube secured with: Tape Dental Injury: Teeth and Oropharynx as per pre-operative assessment

## 2016-10-21 NOTE — Transfer of Care (Signed)
Immediate Anesthesia Transfer of Care Note  Patient: Francis Brewer  Procedure(s) Performed: Procedure(s) with comments: RIGHT TOTAL ANKLE ARTHOPLASTY (Right) - requests at total of 161mins HARDWARE REMOVAL (Right) ACHILLES TENDON LENGTHENING (Right)  Patient Location: PACU  Anesthesia Type:GA combined with regional for post-op pain  Level of Consciousness: awake, alert , oriented and patient cooperative  Airway & Oxygen Therapy: Patient Spontanous Breathing and Patient connected to nasal cannula oxygen  Post-op Assessment: Report given to RN and Post -op Vital signs reviewed and stable  Post vital signs: Reviewed and stable  Last Vitals:  Vitals:   10/21/16 0557 10/21/16 1012  BP: 119/69   Pulse: 85   Resp: 19   Temp: 37.2 C 36.6 C    Last Pain:  Vitals:   10/21/16 0557  TempSrc: Oral         Complications: No apparent anesthesia complications

## 2016-10-21 NOTE — Discharge Instructions (Signed)
John Hewitt, MD °[facility] ° °Please read the following information regarding your care after surgery. ° °Medications  °You only need a prescription for the narcotic pain medicine (ex. oxycodone, Percocet, Norco).  All of the other medicines listed below are available over the counter. °X acetominophen (Tylenol) 650 mg every 4-6 hours as you need for minor pain °X oxycodone as prescribed for moderate to severe pain °X aleve 220 mg - 2 tablets twice daily with food as needed for pain.  ° °Narcotic pain medicine (ex. oxycodone, Percocet, Vicodin) will cause constipation.  To prevent this problem, take the following medicines while you are taking any pain medicine. °X docusate sodium (Colace) 100 mg twice a day X senna (Senokot) 2 tablets twice a day ° °X To help prevent blood clots, take a baby aspirin (81 mg) twice a day after surgery.  You should also get up every hour while you are awake to move around.   ° °Weight Bearing °X Do not bear any weight on the operated leg or foot. ° °Cast / Splint / Dressing °X Keep your splint or cast clean and dry.  Don’t put anything (coat hanger, pencil, etc) down inside of it.  If it gets damp, use a hair dryer on the cool setting to dry it.  If it gets soaked, call the office to schedule an appointment for a cast change. ° °After your dressing, cast or splint is removed; you may shower, but do not soak or scrub the wound.  Allow the water to run over it, and then gently pat it dry. ° °Swelling °It is normal for you to have swelling where you had surgery.  To reduce swelling and pain, keep your toes above your nose for at least 3 days after surgery.  It may be necessary to keep your foot or leg elevated for several weeks.  If it hurts, it should be elevated. ° °Follow Up °Call my office at [phone] when you are discharged from the hospital or surgery center to schedule an appointment to be seen two weeks after surgery. ° °Call my office at [phone] if you  develop a fever >101.5° F, nausea, vomiting, bleeding from the surgical site or severe pain.   ° ° °

## 2016-10-21 NOTE — Anesthesia Preprocedure Evaluation (Addendum)
Anesthesia Evaluation  Patient identified by MRN, date of birth, ID band Patient awake    Reviewed: Allergy & Precautions, NPO status , Patient's Chart, lab work & pertinent test results  History of Anesthesia Complications Negative for: history of anesthetic complications  Airway Mallampati: II  TM Distance: >3 FB Neck ROM: Full    Dental  (+) Edentulous Upper, Edentulous Lower   Pulmonary neg pulmonary ROS, former smoker,    breath sounds clear to auscultation       Cardiovascular hypertension,  Rhythm:Regular Rate:Normal     Neuro/Psych    GI/Hepatic GERD  ,  Endo/Other  Hypothyroidism   Renal/GU      Musculoskeletal  (+) Arthritis ,   Abdominal   Peds  Hematology   Anesthesia Other Findings   Reproductive/Obstetrics                            Anesthesia Physical Anesthesia Plan  ASA: II  Anesthesia Plan: General   Post-op Pain Management:  Regional for Post-op pain   Induction: Intravenous  Airway Management Planned: Oral ETT  Additional Equipment:   Intra-op Plan:   Post-operative Plan: Extubation in OR  Informed Consent: I have reviewed the patients History and Physical, chart, labs and discussed the procedure including the risks, benefits and alternatives for the proposed anesthesia with the patient or authorized representative who has indicated his/her understanding and acceptance.   Dental advisory given  Plan Discussed with: CRNA  Anesthesia Plan Comments:         Anesthesia Quick Evaluation

## 2016-10-21 NOTE — H&P (Signed)
Francis ZEPPIERI is an 71 y.o. male.   Chief Complaint: right ankle pain HPI: 71 y/o male with post traumatic arthritis of the right ankle.  He has failed non op treatment and presents today for right total ankle replacement.  Past Medical History:  Diagnosis Date  . Arthritis   . Colon polyp   . GERD (gastroesophageal reflux disease)   . Hyperlipemia   . Hypertension   . Hypothyroidism     Past Surgical History:  Procedure Laterality Date  . CHOLECYSTECTOMY    . COLONOSCOPY    . FOOT SURGERY Right    fracture    History reviewed. No pertinent family history. Social History:  reports that he has quit smoking. He smoked 7.00 packs per day. He has never used smokeless tobacco. He reports that he drinks about 0.6 oz of alcohol per week . He reports that he does not use drugs.  Allergies:  Allergies  Allergen Reactions  . No Known Allergies     Medications Prior to Admission  Medication Sig Dispense Refill  . amLODipine (NORVASC) 10 MG tablet Take 10 mg by mouth daily.    Marland Kitchen atorvastatin (LIPITOR) 40 MG tablet Take 40 mg by mouth daily.    Marland Kitchen ezetimibe (ZETIA) 10 MG tablet Take 10 mg by mouth daily.    Marland Kitchen levothyroxine (SYNTHROID, LEVOTHROID) 75 MCG tablet Take 75 mcg by mouth daily before breakfast.    . Multiple Vitamin (MULTIVITAMIN WITH MINERALS) TABS tablet Take 1 tablet by mouth daily. Centrum silver    . naproxen sodium (ANAPROX) 220 MG tablet Take 220 mg by mouth 2 (two) times daily as needed (for pain.).    Marland Kitchen Omega-3 Fatty Acids (FISH OIL) 1200 MG CAPS Take 1,200 mg by mouth daily. Mega Red 3    . omeprazole (PRILOSEC) 20 MG capsule Take 20 mg by mouth daily before lunch.    . valsartan-hydrochlorothiazide (DIOVAN-HCT) 320-25 MG tablet Take 1 tablet by mouth daily.      No results found for this or any previous visit (from the past 48 hour(s)). No results found.  ROS  No recent f/c/n/v/ wt loss  Blood pressure 119/69, pulse 85, temperature 98.9 F (37.2 C),  temperature source Oral, resp. rate 19, SpO2 97 %. Physical Exam  wn wd male in nad.  A and O x 4.  Mood and affect normal.  EOMI.  resp unlabored.  R ankle with anterolateral scar that is well healed.  Skin o/w healthy and intact.  No lymphadenopathy.  5/5 strength in PF and DF of the ankel and toes.  Sens to LT intact at the anterior ankle and dorsal foot.  2+ dp and pt pulses.    Assessment/Plan R ankle post traumatic arthritis - to OR for right total ankle replacement.  The risks and benefits of the alternative treatment options have been discussed in detail.  The patient wishes to proceed with surgery and specifically understands risks of bleeding, infection, nerve damage, blood clots, need for additional surgery, amputation and death.   Wylene Simmer, MD 11/15/2016, 7:16 AM

## 2016-10-21 NOTE — Anesthesia Procedure Notes (Signed)
Anesthesia Regional Block:  Popliteal block  Pre-Anesthetic Checklist: ,, timeout performed, Correct Patient, Correct Site, Correct Laterality, Correct Procedure, Correct Position, site marked, Risks and benefits discussed,  Surgical consent,  Pre-op evaluation,  At surgeon's request and post-op pain management  Laterality: Right and Lower  Prep: chloraprep       Needles:   Needle Type: Echogenic Stimulator Needle     Needle Length: 9cm 9 cm Needle Gauge: 21 and 21 G  Needle insertion depth: 5 cm   Additional Needles:  Procedures: ultrasound guided (picture in chart) and nerve stimulator Popliteal block Narrative:  Start time: 10/21/2016 7:00 AM End time: 10/21/2016 7:15 AM Injection made incrementally with aspirations every 5 mL.  Performed by: Personally  Anesthesiologist: Strother Everitt

## 2016-10-22 ENCOUNTER — Encounter (HOSPITAL_COMMUNITY): Payer: Self-pay | Admitting: Orthopedic Surgery

## 2016-10-22 MED ORDER — OXYCODONE HCL 5 MG PO TABS: 5.0000 mg | ORAL_TABLET | ORAL | 0 refills | Status: DC | PRN

## 2016-10-22 MED ORDER — SENNA 8.6 MG PO TABS: 2.0000 | ORAL_TABLET | Freq: Two times a day (BID) | ORAL | 0 refills | Status: DC

## 2016-10-22 MED ORDER — ASPIRIN EC 81 MG PO TBEC: 81.0000 mg | DELAYED_RELEASE_TABLET | Freq: Two times a day (BID) | ORAL | 0 refills | Status: AC

## 2016-10-22 MED ORDER — DOCUSATE SODIUM 100 MG PO CAPS: 100.0000 mg | ORAL_CAPSULE | Freq: Two times a day (BID) | ORAL | 0 refills | Status: DC

## 2016-10-22 NOTE — Evaluation (Signed)
Physical Therapy Evaluation Patient Details Name: Francis Brewer MRN: [id number] DOB: 10-Dec-1945 Today's Date: 10/22/2016   History of Present Illness  71 y/o male presents today for right total ankle replacement. Pt  has a past medical history including Arthritis; Hypertension; and Hypothyroidism. Pt has a past surgical history that includes Cholecystectomy; Foot surgery (Right).  Clinical Impression  Pt is POD 1 and moving well with therapy. Prior to admission, pt was completely independent including driving and assisting with picking up his grandson at school. Pt lives with his wife in a two story townhouse with 2 steps leading in through garage and main living environment is on the first floor. Pt will need to negotiate stairs prior to discharge home. Pt will benefit from being seen acutely to address the below deficits to assist with return home. Pt will benefit from HHPT upon discharge in order to maximize his safety and functional outcomes.     Follow Up Recommendations Home health PT    Equipment Recommendations  Rolling walker with 5" wheels;Other (comment) (to assist with in home mobility)    Recommendations for Other Services       Precautions / Restrictions Precautions Precautions: Fall Restrictions Weight Bearing Restrictions: Yes RLE Weight Bearing: Non weight bearing      Mobility  Bed Mobility Overal bed mobility: Needs Assistance Bed Mobility: Supine to Sit     Supine to sit: Min guard     General bed mobility comments: Min guard for safety to EOB  Transfers Overall transfer level: Needs assistance Equipment used:  (knee scooter) Transfers: Sit to/from [facility] Sit to Stand: Min guard Stand pivot transfers: Min guard       General transfer comment: min guard for safety  Ambulation/Gait Ambulation/Gait assistance: Min guard Ambulation Distance (Feet): 40 Feet Assistive device:  (Knee scooter) Gait Pattern/deviations: Step-to  pattern Gait velocity: decreased Gait velocity interpretation: Below normal speed for age/gender General Gait Details: short distance gait in room with knee scooter. Pt is knowledgeable of scooter use.   Stairs            Wheelchair Mobility    Modified Rankin (Stroke Patients Only)       Balance                                             Pertinent Vitals/Pain Pain Assessment: No/denies pain    Home Living Family/patient expects to be discharged to:: Private residence Living Arrangements: Spouse/significant other Available Help at Discharge: Family;Available 24 hours/day Type of Home: House Home Access: Stairs to enter Entrance Stairs-Rails: None Entrance Stairs-Number of Steps: 2 Home Layout: Two level;Able to live on main level with bedroom/bathroom Home Equipment: Crutches;Shower seat;Grab bars - toilet;Hand held shower head (knee scooter) Additional Comments: plans to get around via new scooter    Prior Function Level of Independence: Independent         Comments: was completely independent and driving prior to surgery. was [place] grandson to school, run errads and walk picking up grandson from school. was fairly active.      Hand Dominance   Dominant Hand: Right    Extremity/Trunk Assessment   Upper Extremity Assessment Upper Extremity Assessment: Defer to OT evaluation    Lower Extremity Assessment Lower Extremity Assessment: RLE deficits/detail RLE Deficits / Details: overall 3/5 grossly knee and hip RLE: Unable to fully assess due  to immobilization       Communication   Communication: No difficulties  Cognition Arousal/Alertness: Awake/alert Behavior During Therapy: WFL for tasks assessed/performed Overall Cognitive Status: Within Functional Limits for tasks assessed                      General Comments      Exercises     Assessment/Plan    PT Assessment Patient needs continued PT services  PT  Problem List Decreased strength;Decreased range of motion;Decreased activity tolerance;Decreased balance;Decreased mobility;Decreased knowledge of use of DME          PT Treatment Interventions DME instruction;Gait training;Stair training;Functional mobility training;Therapeutic activities;Therapeutic exercise;Balance training    PT Goals (Current goals can be found in the Care Plan section)  Acute Rehab PT Goals Patient Stated Goal: to get home tomorrow PT Goal Formulation: With patient Time For Goal Achievement: 10/29/16 Potential to Achieve Goals: Good    Frequency 7X/week   Barriers to discharge        Co-evaluation               End of Session Equipment Utilized During Treatment: Gait belt Activity Tolerance: Patient tolerated treatment well Patient left: in chair;with call bell/phone within reach;with family/visitor present Nurse Communication: Mobility status         Time: 1445-1519 PT Time Calculation (min) (ACUTE ONLY): 34 min   Charges:   PT Evaluation $PT Eval Moderate Complexity: 1 Procedure PT Treatments $Gait Training: 8-22 mins   PT G Codes:        Scheryl Marten PT, DPT  [phone]  10/22/2016, 3:27 PM

## 2016-10-22 NOTE — Op Note (Signed)
Francis Brewer, Francis Brewer NO.:  [id number]  MEDICAL RECORD NO.:  [id number]  LOCATION:  [facility]                         FACILITY:  [facility]  PHYSICIAN:  Wylene Simmer, MD        DATE OF BIRTH:  Sep 01, 1946  DATE OF PROCEDURE:  10/21/2016 DATE OF DISCHARGE:                              OPERATIVE REPORT   PREOPERATIVE DIAGNOSES: 1. Right ankle posttraumatic arthritis. 2. Right distal tibia retained hardware. 3. Tight right heel cord.  POSTOPERATIVE DIAGNOSES: 1. Right ankle posttraumatic arthritis. 2. Right distal tibia retained hardware. 3. Tight right heel cord.  PROCEDURE: 1. Removal of deep implants from the right distal tibia. 2. Right total ankle arthroplasty. 3. Right percutaneous Achilles tendon lengthening.  SURGEON:  Wylene Simmer, MD  ASSISTANT:  Mechele Claude, PA-C  ANESTHESIA:  General, regional.  ESTIMATED BLOOD LOSS:  Minimal.  TOURNIQUET TIME:  2 hours 5 minutes at 250 mmHg.  COMPLICATIONS:  None apparent.  DISPOSITION:  Extubated, awake and stable to recovery.  INDICATIONS FOR PROCEDURE:  The patient is a 71 year old man with a history of posttraumatic arthritis of his right ankle.  He had a tibial pilon fracture treated with open reduction and internal fixation more than 20 years ago.  He has failed nonoperative treatment to date including activity modification, oral anti-inflammatories, bracing, and shoe wear modification.  He presents today for total ankle replacement. He understands the risks and benefits of the alternative treatment options and elects surgical treatment.  He specifically understands risks of bleeding, infection, nerve damage, blood clots, need for additional surgery, continued pain, amputation, and death.  PROCEDURE IN DETAIL:  After preoperative consent was obtained and the correct operative site was identified, the patient was brought to the operating room and placed supine on the operating table.  General anesthesia  was induced.  Preoperative antibiotics were administered. Surgical time-out was taken.  The right lower extremity was prepped and draped in standard sterile fashion with tourniquet around the thigh. Extremity was exsanguinated and tourniquet was inflated to 250 mmHg.  An incision was then made over the anterior aspect of the ankle, curving laterally to incorporate the previous anterolateral incision and then medially over the dorsum of the foot.  The incision was deepened to the level of the fascia and extensor retinaculum.  The retinaculum was incised and a full-thickness flap was elevated and retracted medially. The extensor tendons were retracted laterally and the anterior tibia was exposed.  The neurovascular bundle was mobilized and retracted laterally and protected throughout the case.  The anterior ankle joint capsule was incised and elevated medially and laterally.  Anterior osteophytes were removed with a rongeur.  A quarter-inch osteotome was inserted into the medial gutter.  The tibial alignment guide was then attached to a guide pin placed in the tibial tubercle.  Radiographs were used to align the guide with the tibial shaft in the anterior and lateral projections. The cutting block was pinned then at the distal tibia.  The resection level was set on the lateral view with the Angel wing.  The tibial cutting block was then pinned into position after setting the medial- lateral translation on the AP view.  The cutting  guide was applied and pinned into position.  Distal tibial cut was made using fluoroscopy to guide the saw blade at the posterior aspect of the tibia.  The cut bone was removed with curettes and rongeurs after morselizing it with the reciprocating saw.  The appropriate resection level was measured with the resection bar.  The talar cutting guide was then applied and positioned appropriately.  It was pinned into position.  The superior cut on the talus was made and  the waste bone removed.  The talus was sized as a medium and the datum was pinned into position.  The anterior and posterior cuts were made followed by the medial and lateral cuts. All waste bone was removed.  The window trial was inserted and was noted to fit appropriately.  It was pinned into position and the keel hole drilled and punched.  The wound was then irrigated copiously. Vancomycin powder was sprinkled over the bone and the size medium STAR talar component was impacted into position.  The distal tibia was measured and a size extra-large tibial trial component was inserted.  It was pinned into position.  AP and lateral radiographs confirmed appropriate sizing of the component.  The barrel holes were noted to interfere with the distal tibial hardware.  Dissection was then carried around to the screw heads just anterior to the fibula.  The most distal screw head was cleared of all soft tissue and bone with rongeurs.  Screwdriver was used to remove the screw in its entirety.  The proximal screw was also identified and had cleared of bone and soft tissue.  The screw was removed in its entirety.  Attention was then returned to the distal tibial trial implant.  Both barrel holes were then drilled and punched.  The trial was removed.  The wound was irrigated.  Vancomycin powder was sprinkled on the wound and the size extra large tibial component was inserted and impacted into position.  An 8 mm trial poly was inserted.  The heel cord was noted to be extremely tight with the knee both flexed and extended.  Percutaneous heel cord lengthening was performed using the triple hemi-section technique.  The ankle was then dorsiflexed approximately 15 degrees with the knee extended.  The trial poly was removed.  The wound was irrigated.  Vancomycin powder was again sprinkled on the wound and the 8 mm polyethylene spacer was inserted.  Bone graft was then inserted at the anterior barrel  holes. Final AP, mortise and lateral radiographs confirmed appropriate position and length of all hardware and appropriate alignment of the components as well as interval removal of the 2 distal tibial screws.  The anterior joint capsule was then repaired over the joint with simple sutures of 0 Vicryl.  Extensor retinaculum was repaired with simple sutures of 0 Vicryl.  Subcutaneous tissues were approximated with Monocryl and the skin was closed with a running 3-0 nylon.  Sterile dressings were applied followed by a well-padded, short-leg cast.  Tourniquet was released after application of the dressings at 2 hours and 5 minutes. The patient was awakened from anesthesia and transported to the recovery room in stable condition.  FOLLOWUP PLAN:  The patient will be admitted for physical therapy and pain control.  He will start DVT prophylaxis with Lovenox tomorrow.  Mechele Claude, PA-C, was present and scrubbed for the duration of the case.  His assistance was essential in positioning the patient, prepping and draping, gaining, maintaining exposure performing the operation, closing and dressing the  wounds and applying the cast.     Wylene Simmer, MD     JH/MEDQ  D:  10/21/2016  T:  10/22/2016  Job:  [id number]

## 2016-10-22 NOTE — Progress Notes (Signed)
Subjective: 1 Day Post-Op Procedure(s) (LRB): RIGHT TOTAL ANKLE ARTHOPLASTY (Right) HARDWARE REMOVAL (Right) ACHILLES TENDON LENGTHENING (Right)  Patient reports pain as mild to moderate.  Tolerating POs well.  Admits to flatulence.  Denies fever, chills, N/V.    Objective:   VITALS:  Temp:  [97.3 F (36.3 C)-98.3 F (36.8 C)] 97.8 F (36.6 C) (01/26 0500) Pulse Rate:  [68-94] 68 (01/26 0500) Resp:  [14-27] 18 (01/26 0500) BP: (108-140)/(64-85) 120/66 (01/26 0500) SpO2:  [92 %-100 %] 99 % (01/26 0500)  General: WDWN patient in NAD. Psych:  Appropriate mood and affect. Neuro:  A&O x 3, Moving all extremities, sensation intact to light touch HEENT:  EOMs intact Chest:  Even non-labored respirations Skin:  NWB SLC C/D/I, no rashes or lesions Extremities: warm/dry, no visible edema, erythema, or echymosis.  No lymphadenopathy. Pulses: Femoral 2+ MSK:  ROM: EHL/FHL intact, MMT: patient is able to perform quad set    LABS No results for input(s): HGB, WBC, PLT in the last 72 hours. No results for input(s): NA, K, CL, CO2, BUN, CREATININE, GLUCOSE in the last 72 hours. No results for input(s): LABPT, INR in the last 72 hours.   Assessment/Plan: 1 Day Post-Op Procedure(s) (LRB): RIGHT TOTAL ANKLE ARTHOPLASTY (Right) HARDWARE REMOVAL (Right) ACHILLES TENDON LENGTHENING (Right)  Up with therapy NWB R LE Plan for D/C home tomorrow. Discharge summary pended. Scripts on chart. Plan for 3 week outpatient post op visit with Dr. Doran Durand. ASA for DVT prophylaxis  Mechele Claude, PA-C, [place] Office:  [phone]

## 2016-10-22 NOTE — Discharge Summary (Signed)
Physician Discharge Summary  Patient ID: Francis Brewer MRN: [id number] DOB/AGE: [birth year] 71 y.o.  Admit date: 10/21/2016 Discharge date: 10/23/2016 Admission Diagnoses:  R post traumatic ankle arthritis; HTN; hyperlipidemia; hypothyroid  Discharge Diagnoses:  Active Problems:   History of total replacement of right ankle same as above  Discharged Condition: stable  Hospital Course: Patient presented to the [facility] on 10/21/2016 for elective R Total Ankle Replacement by Dr. Wylene Simmer.  The patient tolerated the procedure well without complication.  The patient was then admitted to the hospital.   He worked with therapy and will be D/C'd home on 10/23/2016.  His hospital course was unremarkable.  Consults: PT/OT/case management  Significant Diagnostic Studies: radiology: X-Ray: to ensure satisfactory anatomic alignment during operative procedure.  Treatments: IV hydration, antibiotics: Ancef, analgesia: acetaminophen, acetaminophen w/ codeine and Morphine, cardiac meds: amlodipine and irbesartan/HCTZ, anticoagulation: lovenox and surgery: as stated above.  Discharge Exam: Blood pressure 120/66, pulse 68, temperature 97.8 F (36.6 C), temperature source Oral, resp. rate 18, SpO2 99 %. General: WDWN patient in NAD. Psych:  Appropriate mood and affect. Neuro:  A&O x 3, Moving all extremities, sensation intact to light touch HEENT:  EOMs intact Chest:  Even non-labored respirations Skin:  NWB SLC C/D/I, no rashes or lesions Extremities: warm/dry, no visible edema, erythema, or echymosis.  No lymphadenopathy. Pulses: Femoral 2+ MSK:  ROM: EHL/FHL intact, MMT: patient is able to perform quad set   Disposition: Home  Allergies as of 10/22/2016      Reactions   No Known Allergies       Medication List    TAKE these medications   amLODipine 10 MG tablet Commonly known as:  NORVASC Take 10 mg by mouth daily.   aspirin EC 81 MG tablet Take 1 tablet (81 mg total) by mouth  2 (two) times daily.   atorvastatin 40 MG tablet Commonly known as:  LIPITOR Take 40 mg by mouth daily.   docusate sodium 100 MG capsule Commonly known as:  COLACE Take 1 capsule (100 mg total) by mouth 2 (two) times daily. While taking narcotic pain medicine.   ezetimibe 10 MG tablet Commonly known as:  ZETIA Take 10 mg by mouth daily.   Fish Oil 1200 MG Caps Take 1,200 mg by mouth daily. Mega Red 3   levothyroxine 75 MCG tablet Commonly known as:  SYNTHROID, LEVOTHROID Take 75 mcg by mouth daily before breakfast.   multivitamin with minerals Tabs tablet Take 1 tablet by mouth daily. Centrum silver   naproxen sodium 220 MG tablet Commonly known as:  ANAPROX Take 220 mg by mouth 2 (two) times daily as needed (for pain.).   omeprazole 20 MG capsule Commonly known as:  PRILOSEC Take 20 mg by mouth daily before lunch.   oxyCODONE 5 MG immediate release tablet Commonly known as:  ROXICODONE Take 1-2 tablets (5-10 mg total) by mouth every 4 (four) hours as needed for moderate pain or severe pain.   senna 8.6 MG Tabs tablet Commonly known as:  SENOKOT Take 2 tablets (17.2 mg total) by mouth 2 (two) times daily.   valsartan-hydrochlorothiazide 320-25 MG tablet Commonly known as:  DIOVAN-HCT Take 1 tablet by mouth daily.      Follow-up Information    Kenslee Achorn, MD. Schedule an appointment as soon as possible for a visit in 3 week(s).   Specialty:  Orthopedic Surgery Contact information: [address] [place] [address] [phone]  Signed: Mechele Claude, PA-C, [facility] Office:  [phone]

## 2016-10-22 NOTE — Evaluation (Signed)
Occupational Therapy Evaluation Patient Details Name: Francis Brewer DOCTOR MRN: [id number] DOB: [birth year] Today's Date: 10/22/2016    History of Present Illness 71 y/o male presents today for right total ankle replacement. Pt  has a past medical history including Arthritis; Hypertension; and Hypothyroidism. Pt has a past surgical history that includes Cholecystectomy; Foot surgery (Right).   Clinical Impression   PTA Pt very independent in ADL and mobility. Pt currently min guard for ADL and min guard for mobility with RW. Pt very anxious about tub transfer and toilet transfer, and will benefit from skilled OT in the acute care setting to maximize safety and independence in ADL and functional transfers. Next session to focus on tub transfer and standing up from toilet.    Follow Up Recommendations  No OT follow up;Supervision - Intermittent    Equipment Recommendations  None recommended by OT    Recommendations for Other Services       Precautions / Restrictions Precautions Precautions: Fall Restrictions Weight Bearing Restrictions: Yes RLE Weight Bearing: Non weight bearing      Mobility Bed Mobility Overal bed mobility: Needs Assistance Bed Mobility: Supine to Sit     Supine to sit: Min guard     General bed mobility comments: Pt sitting OOB in recliner when OT entered the room  Transfers Overall transfer level: Needs assistance Equipment used: Rolling walker (2 wheeled) Transfers: Sit to/from Stand Sit to Stand: Min guard Stand pivot transfers: Min guard       General transfer comment: vc for safe hand placement    Balance                                            ADL Overall ADL's : Needs assistance/impaired     Grooming: Modified independent;Sitting   Upper Body Bathing: Modified independent;Sitting   Lower Body Bathing: Modified independent;Sitting/lateral leans   Upper Body Dressing : Modified independent;Sitting   Lower  Body Dressing: Moderate assistance;Sit to/from stand;With caregiver independent assisting   Toilet Transfer: Min guard;Cueing for sequencing;Ambulation;RW;BSC Toilet Transfer Details (indicate cue type and reason): Pt stated that he has trouble standing up, cues to bring bottom to edge of toilet and put good leg underneath him more for body positioning     Tub/ Shower Transfer: Tub transfer;Min guard;With caregiver independent assisting;Anterior/posterior;Shower Technical sales engineer Details (indicate cue type and reason): demonstrated by OT various techniques with RW Functional mobility during ADLs: Supervision/safety;Rolling walker (good maintenance of NWB) General ADL Comments: Pt's wife willing and able to assist as needed     Vision Vision Assessment?: No apparent visual deficits   Perception     Praxis      Pertinent Vitals/Pain Pain Assessment: No/denies pain     Hand Dominance Right   Extremity/Trunk Assessment Upper Extremity Assessment Upper Extremity Assessment: Overall WFL for tasks assessed   Lower Extremity Assessment Lower Extremity Assessment: RLE deficits/detail;Defer to PT evaluation RLE Deficits / Details: overall 3/5 grossly knee and hip RLE: Unable to fully assess due to immobilization   Cervical / Trunk Assessment Cervical / Trunk Assessment: Normal   Communication Communication Communication: No difficulties   Cognition Arousal/Alertness: Awake/alert Behavior During Therapy: WFL for tasks assessed/performed Overall Cognitive Status: Within Functional Limits for tasks assessed                     General Comments  Exercises       Shoulder Instructions      Home Living Family/patient expects to be discharged to:: Private residence Living Arrangements: Spouse/significant other Available Help at Discharge: Family;Available 24 hours/day Type of Home: House Home Access: Stairs to enter [employer] of  Steps: 2 Entrance Stairs-Rails: None Home Layout: Two level;Able to live on main level with bedroom/bathroom Alternate Level Stairs-Number of Steps: n/a at this time   Bathroom Shower/Tub: Tub/shower unit;Curtain Shower/tub characteristics: Architectural technologist: Handicapped height Bathroom Accessibility: Yes   Home Equipment: Crutches;Shower seat;Grab bars - toilet;Hand held shower head (knee scooter)   Additional Comments: plans to get around via new scooter      Prior Functioning/Environment Level of Independence: Independent        Comments: was completely independent and driving prior to surgery. was [place] grandson to school, run errads and walk picking up grandson from school. was fairly active.         OT Problem List: Decreased activity tolerance;Impaired balance (sitting and/or standing);Decreased safety awareness;Decreased knowledge of use of DME or AE   OT Treatment/Interventions: Self-care/ADL training;Balance training;Patient/family education;DME and/or AE instruction;Therapeutic activities    OT Goals(Current goals can be found in the care plan section) Acute Rehab OT Goals Patient Stated Goal: to get home tomorrow OT Goal Formulation: With patient Time For Goal Achievement: 10/29/16 Potential to Achieve Goals: Good ADL Goals Pt Will Transfer to Toilet: with supervision;ambulating;bedside commode Pt Will Perform Toileting - Clothing Manipulation and hygiene: with modified independence;sit to/from stand Pt Will Perform Tub/Shower Transfer: Tub transfer;with min guard assist;with caregiver independent in assisting;anterior/posterior transfer;shower seat;rolling walker  OT Frequency: Min 2X/week   Barriers to D/C:    none       Co-evaluation              End of Session Equipment Utilized During Treatment: Gait belt;Rolling walker Nurse Communication: Mobility status;Other (comment) (safety status with RW)  Activity Tolerance: Patient tolerated  treatment well Patient left: in chair;with call bell/phone within reach;with family/visitor present   Time: [id number] OT Time Calculation (min): 31 min Charges:  OT General Charges $OT Visit: 1 Procedure OT Evaluation $OT Eval Low Complexity: 1 Procedure OT Treatments $Self Care/Home Management : 8-22 mins G-Codes:    Merri Ray Aireanna Luellen 2016/10/26, 4:42 PM Hulda Humphrey OTR/L [phone]

## 2016-10-23 NOTE — Progress Notes (Signed)
Physical Therapy Treatment Patient Details Name: Francis Brewer MRN: [id number] DOB: October 15, 1945 Today's Date: 10/23/2016    History of Present Illness 71 y/o male presents today for right total ankle replacement. Pt  has a past medical history including Arthritis; Hypertension; and Hypothyroidism. Pt has a past surgical history that includes Cholecystectomy; Foot surgery (Right).    PT Comments    Pt is POD 2 and moving well with therapy. Performed gait and stair negotiation training with crutches. Pt is more comfortable with crutches for stair training and is able to perform with min guard assistance. Pt required 2 rest breaks with knee scooter for longer distance gait. Pt is making good progress toward goals and continues to benefit from additional therapy services once he returns home.    Follow Up Recommendations  Home health PT     Equipment Recommendations  Rolling walker with 5" wheels;Other (comment) (to assist with in home mobility)    Recommendations for Other Services       Precautions / Restrictions Precautions Precautions: Fall Restrictions Weight Bearing Restrictions: Yes RLE Weight Bearing: Non weight bearing    Mobility  Bed Mobility Overal bed mobility: Needs Assistance Bed Mobility: Supine to Sit     Supine to sit: Modified independent (Device/Increase time)     General bed mobility comments: Able to get up and dressed without assistance.   Transfers Overall transfer level: Needs assistance Equipment used: Rolling walker (2 wheeled) Transfers: Sit to/from Stand Sit to Stand: Min guard         General transfer comment: vc for safe hand placement  Ambulation/Gait Ambulation/Gait assistance: Min guard Ambulation Distance (Feet): 150 Feet Assistive device: 4-wheeled walker (Knee scooter) Gait Pattern/deviations: Step-to pattern Gait velocity: decreased Gait velocity interpretation: Below normal speed for age/gender General Gait Details: good  step to pattern with knee scooter. Requires 2 rest breaks.    Stairs Stairs: Yes   Stair Management: With crutches;Forwards Number of Stairs: 2 General stair comments: ascend and descend 2 stairs with cues for sequencing.   Wheelchair Mobility    Modified Rankin (Stroke Patients Only)       Balance                                    Cognition Arousal/Alertness: Awake/alert Behavior During Therapy: WFL for tasks assessed/performed Overall Cognitive Status: Within Functional Limits for tasks assessed                      Exercises      General Comments        Pertinent Vitals/Pain Pain Assessment: No/denies pain    Home Living                      Prior Function            PT Goals (current goals can now be found in the care plan section) Acute Rehab PT Goals Patient Stated Goal: to get home today Progress towards PT goals: Progressing toward goals    Frequency    7X/week      PT Plan Current plan remains appropriate    Co-evaluation             End of Session Equipment Utilized During Treatment: Gait belt Activity Tolerance: Patient tolerated treatment well Patient left: Other (comment) (left with OT)     Time: [id number] PT Time Calculation (min) (  ACUTE ONLY): 16 min  Charges:  $Gait Training: 8-22 mins                    G Codes:      Scheryl Marten PT, DPT  [phone]  10/23/2016, 8:16 AM

## 2016-10-23 NOTE — Progress Notes (Signed)
Occupational Therapy Treatment Patient Details Name: Francis Brewer MRN: [id number] DOB: [birth year] Today's Date: 10/23/2016    History of present illness 71 y/o male presents today for right total ankle replacement. Pt  has a past medical history including Arthritis; Hypertension; and Hypothyroidism. Pt has a past surgical history that includes Cholecystectomy; Foot surgery (Right).   OT comments  Pt making progress towards OT goals this session. Focused on tub transfers in the ortho gym with various supports in the tub (3 in 1, shower chair) that were most like what he has at home. Pt at adequate level from OT standpoint to dc to venue below. Pt with no further questions or concerns at the end of session, and stated "I feel much better about going home after OT and PT"  Follow Up Recommendations  No OT follow up;Supervision - Intermittent    Equipment Recommendations  None recommended by OT    Recommendations for Other Services      Precautions / Restrictions Precautions Precautions: Fall Restrictions Weight Bearing Restrictions: Yes RLE Weight Bearing: Non weight bearing       Mobility Bed Mobility Overal bed mobility: Needs Assistance Bed Mobility: Supine to Sit     Supine to sit: Modified independent (Device/Increase time)     General bed mobility comments: Pt met OOB in ortho gym  Transfers Overall transfer level: Needs assistance Equipment used: Rolling walker (2 wheeled) (scooter) Transfers: Sit to/from Stand Sit to Stand: Min guard         General transfer comment: vc for safe hand placement    Balance Overall balance assessment: Needs assistance Sitting-balance support: No upper extremity supported;Feet supported Sitting balance-Leahy Scale: Good     Standing balance support: Bilateral upper extremity supported;Single extremity supported;During functional activity Standing balance-Leahy Scale: Fair Standing balance comment: able to maintain standing  with SUE support during transfers                   ADL Overall ADL's : Needs assistance/impaired                         Toilet Transfer: Min guard;Cueing for sequencing;Ambulation;RW;BSC Toilet Transfer Details (indicate cue type and reason): Pt able to sit > stand with no physical assist this session with body repositioning      Tub/ Shower Transfer: Tub transfer;Min guard;Anterior/posterior;Shower seat;Rolling walker;3 in 1 Tub/Shower Transfer Details (indicate cue type and reason): Pt performed x3 in ortho gym with 3 in 1 and also with shower chair (since Pt has both at home), stated "I feel so much more comfortable now" Functional mobility during ADLs: Supervision/safety;Rolling walker (scooter)        Vision                     Perception     Praxis      Cognition   Behavior During Therapy: [facility] for tasks assessed/performed Overall Cognitive Status: Within Functional Limits for tasks assessed                       Extremity/Trunk Assessment               Exercises     Shoulder Instructions       General Comments      Pertinent Vitals/ Pain       Pain Assessment: No/denies pain  Home Living  Prior Functioning/Environment              Frequency  Min 2X/week        Progress Toward Goals  OT Goals(current goals can now be found in the care plan section)  Progress towards OT goals: Progressing toward goals  Acute Rehab OT Goals Patient Stated Goal: to get home today OT Goal Formulation: With patient Time For Goal Achievement: 10/29/16 Potential to Achieve Goals: Good  Plan Discharge plan remains appropriate    Co-evaluation                 End of Session Equipment Utilized During Treatment: Gait belt;Rolling walker;Other (comment) (scooter)   Activity Tolerance Patient tolerated treatment well   Patient Left in chair;with call  bell/phone within reach;Other (comment) (fresh coffee)   Nurse Communication Mobility status;Weight bearing status        Time: 8485-9276 OT Time Calculation (min): 24 min  Charges: OT General Charges $OT Visit: 1 Procedure OT Treatments $Self Care/Home Management : 23-37 mins  Jaci Carrel 10/23/2016, 8:49 AM  Hulda Humphrey OTR/L [phone]

## 2016-10-23 NOTE — Care Management Note (Signed)
Case Management Note  Patient Details  Name: Francis Brewer MRN: [id number] Date of Birth: 1946-08-31  Subjective/Objective:                  right total ankle replacement Action/Plan: Discharge planning Expected Discharge Date:  10/23/16               Expected Discharge Plan:  [place]  In-House Referral:     Discharge planning Services  CM Consult  Post Acute Care Choice:  Home Health Choice offered to:  Patient  DME Arranged:  Walker rolling DME Agency:  [place] Arranged:  PT [facility] Agency:  [place]  Status of Service:  Completed, signed off  If discussed at [place] of Stay Meetings, dates discussed:    Additional Comments: Cm met with pt in room to offer choice of home health agency. Pt chooses [facility] to render HHPT. CM has requested a face to face and an order for HHPT. Referral called to [facility] rep, Jermaine. CM notified [facility] rep, Brad for rolling walker.  Though pt has a non-weight bearing order, and pt verbalized understanding of nono-weight bearing, pt states plan is to use the rolling walker when toileting and is only using rolling walker to "hop" and for balance when toileting per PT eval and recc. No other CM needs were communicated. Dellie Catholic, RN 10/23/2016, 11:08 AM

## 2016-11-04 DIAGNOSIS — Z4789 Encounter for other orthopedic aftercare: Secondary | ICD-10-CM | POA: Diagnosis not present

## 2016-11-04 DIAGNOSIS — Z96661 Presence of right artificial ankle joint: Secondary | ICD-10-CM | POA: Diagnosis not present

## 2016-11-11 DIAGNOSIS — Z4789 Encounter for other orthopedic aftercare: Secondary | ICD-10-CM | POA: Diagnosis not present

## 2016-11-11 DIAGNOSIS — Z96661 Presence of right artificial ankle joint: Secondary | ICD-10-CM | POA: Diagnosis not present

## 2016-11-12 DIAGNOSIS — R600 Localized edema: Secondary | ICD-10-CM | POA: Diagnosis not present

## 2016-11-22 DIAGNOSIS — E876 Hypokalemia: Secondary | ICD-10-CM | POA: Diagnosis not present

## 2016-11-22 DIAGNOSIS — R600 Localized edema: Secondary | ICD-10-CM | POA: Diagnosis not present

## 2016-11-24 DIAGNOSIS — M25571 Pain in right ankle and joints of right foot: Secondary | ICD-10-CM | POA: Diagnosis not present

## 2016-11-26 DIAGNOSIS — M25571 Pain in right ankle and joints of right foot: Secondary | ICD-10-CM | POA: Diagnosis not present

## 2016-11-29 DIAGNOSIS — M25571 Pain in right ankle and joints of right foot: Secondary | ICD-10-CM | POA: Diagnosis not present

## 2016-11-30 DIAGNOSIS — M79602 Pain in left arm: Secondary | ICD-10-CM | POA: Diagnosis not present

## 2016-11-30 DIAGNOSIS — R600 Localized edema: Secondary | ICD-10-CM | POA: Diagnosis not present

## 2016-11-30 DIAGNOSIS — E876 Hypokalemia: Secondary | ICD-10-CM | POA: Diagnosis not present

## 2016-11-30 DIAGNOSIS — M79601 Pain in right arm: Secondary | ICD-10-CM | POA: Diagnosis not present

## 2016-12-02 DIAGNOSIS — M9902 Segmental and somatic dysfunction of thoracic region: Secondary | ICD-10-CM | POA: Diagnosis not present

## 2016-12-02 DIAGNOSIS — M9901 Segmental and somatic dysfunction of cervical region: Secondary | ICD-10-CM | POA: Diagnosis not present

## 2016-12-02 DIAGNOSIS — M9903 Segmental and somatic dysfunction of lumbar region: Secondary | ICD-10-CM | POA: Diagnosis not present

## 2016-12-02 DIAGNOSIS — M50322 Other cervical disc degeneration at C5-C6 level: Secondary | ICD-10-CM | POA: Diagnosis not present

## 2016-12-02 DIAGNOSIS — M5136 Other intervertebral disc degeneration, lumbar region: Secondary | ICD-10-CM | POA: Diagnosis not present

## 2016-12-02 DIAGNOSIS — M5412 Radiculopathy, cervical region: Secondary | ICD-10-CM | POA: Diagnosis not present

## 2016-12-02 DIAGNOSIS — M5031 Other cervical disc degeneration,  high cervical region: Secondary | ICD-10-CM | POA: Diagnosis not present

## 2016-12-03 DIAGNOSIS — M25571 Pain in right ankle and joints of right foot: Secondary | ICD-10-CM | POA: Diagnosis not present

## 2016-12-06 DIAGNOSIS — M5136 Other intervertebral disc degeneration, lumbar region: Secondary | ICD-10-CM | POA: Diagnosis not present

## 2016-12-06 DIAGNOSIS — M9902 Segmental and somatic dysfunction of thoracic region: Secondary | ICD-10-CM | POA: Diagnosis not present

## 2016-12-06 DIAGNOSIS — M9901 Segmental and somatic dysfunction of cervical region: Secondary | ICD-10-CM | POA: Diagnosis not present

## 2016-12-06 DIAGNOSIS — M9903 Segmental and somatic dysfunction of lumbar region: Secondary | ICD-10-CM | POA: Diagnosis not present

## 2016-12-06 DIAGNOSIS — M5031 Other cervical disc degeneration,  high cervical region: Secondary | ICD-10-CM | POA: Diagnosis not present

## 2016-12-06 DIAGNOSIS — M50322 Other cervical disc degeneration at C5-C6 level: Secondary | ICD-10-CM | POA: Diagnosis not present

## 2016-12-06 DIAGNOSIS — M5412 Radiculopathy, cervical region: Secondary | ICD-10-CM | POA: Diagnosis not present

## 2016-12-07 DIAGNOSIS — M25571 Pain in right ankle and joints of right foot: Secondary | ICD-10-CM | POA: Diagnosis not present

## 2016-12-08 DIAGNOSIS — Z4789 Encounter for other orthopedic aftercare: Secondary | ICD-10-CM | POA: Diagnosis not present

## 2016-12-08 DIAGNOSIS — Z96661 Presence of right artificial ankle joint: Secondary | ICD-10-CM | POA: Diagnosis not present

## 2016-12-10 DIAGNOSIS — M25571 Pain in right ankle and joints of right foot: Secondary | ICD-10-CM | POA: Diagnosis not present

## 2016-12-14 ENCOUNTER — Ambulatory Visit (INDEPENDENT_AMBULATORY_CARE_PROVIDER_SITE_OTHER): Payer: Medicare Other | Admitting: Neurology

## 2016-12-14 ENCOUNTER — Encounter: Payer: Self-pay | Admitting: Neurology

## 2016-12-14 VITALS — BP 133/82 | HR 85 | Ht 67.5 in | Wt 199.0 lb

## 2016-12-14 DIAGNOSIS — M542 Cervicalgia: Secondary | ICD-10-CM | POA: Diagnosis not present

## 2016-12-14 DIAGNOSIS — R202 Paresthesia of skin: Secondary | ICD-10-CM | POA: Diagnosis not present

## 2016-12-14 DIAGNOSIS — Z96661 Presence of right artificial ankle joint: Secondary | ICD-10-CM

## 2016-12-14 MED ORDER — TRAMADOL HCL 50 MG PO TABS: 50.0000 mg | ORAL_TABLET | Freq: Every evening | ORAL | 3 refills | Status: DC | PRN

## 2016-12-14 MED ORDER — GABAPENTIN 300 MG PO CAPS: 300.0000 mg | ORAL_CAPSULE | Freq: Three times a day (TID) | ORAL | 11 refills | Status: DC
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
## 2016-12-17 DIAGNOSIS — M25571 Pain in right ankle and joints of right foot: Secondary | ICD-10-CM | POA: Diagnosis not present

## 2016-12-20 DIAGNOSIS — M25571 Pain in right ankle and joints of right foot: Secondary | ICD-10-CM | POA: Diagnosis not present

## 2016-12-22 DIAGNOSIS — M25571 Pain in right ankle and joints of right foot: Secondary | ICD-10-CM | POA: Diagnosis not present

## 2016-12-25 ENCOUNTER — Ambulatory Visit
Admission: RE | Admit: 2016-12-25 | Discharge: 2016-12-25 | Disposition: A | Discharge status: Home / Self Care | Payer: Medicare Other | Source: Ambulatory Visit | Attending: Neurology | Admitting: Neurology

## 2016-12-25 DIAGNOSIS — M542 Cervicalgia: Secondary | ICD-10-CM | POA: Diagnosis not present

## 2016-12-25 DIAGNOSIS — R202 Paresthesia of skin: Secondary | ICD-10-CM | POA: Diagnosis not present

## 2016-12-25 DIAGNOSIS — Z96661 Presence of right artificial ankle joint: Secondary | ICD-10-CM

## 2016-12-25 DIAGNOSIS — M4802 Spinal stenosis, cervical region: Secondary | ICD-10-CM | POA: Diagnosis not present

## 2016-12-28 DIAGNOSIS — M79601 Pain in right arm: Secondary | ICD-10-CM | POA: Diagnosis not present

## 2016-12-28 DIAGNOSIS — E876 Hypokalemia: Secondary | ICD-10-CM | POA: Diagnosis not present

## 2016-12-28 DIAGNOSIS — M79602 Pain in left arm: Secondary | ICD-10-CM | POA: Diagnosis not present

## 2016-12-28 DIAGNOSIS — R600 Localized edema: Secondary | ICD-10-CM | POA: Diagnosis not present

## 2016-12-28 DIAGNOSIS — M25571 Pain in right ankle and joints of right foot: Secondary | ICD-10-CM | POA: Diagnosis not present

## 2016-12-29 ENCOUNTER — Other Ambulatory Visit: Payer: Self-pay | Admitting: *Deleted

## 2016-12-29 DIAGNOSIS — R609 Edema, unspecified: Secondary | ICD-10-CM

## 2016-12-30 ENCOUNTER — Encounter: Payer: Self-pay | Admitting: Vascular Surgery

## 2016-12-30 ENCOUNTER — Ambulatory Visit (INDEPENDENT_AMBULATORY_CARE_PROVIDER_SITE_OTHER): Payer: Medicare Other | Admitting: Vascular Surgery

## 2016-12-30 ENCOUNTER — Ambulatory Visit (HOSPITAL_COMMUNITY)
Admission: RE | Admit: 2016-12-30 | Discharge: 2016-12-30 | Disposition: A | Discharge status: Home / Self Care | Payer: Medicare Other | Source: Ambulatory Visit | Attending: Vascular Surgery | Admitting: Vascular Surgery

## 2016-12-30 VITALS — BP 136/75 | HR 122 | Temp 97.0°F | Resp 16 | Ht 67.5 in | Wt 204.0 lb

## 2016-12-30 DIAGNOSIS — R609 Edema, unspecified: Secondary | ICD-10-CM | POA: Insufficient documentation

## 2016-12-30 DIAGNOSIS — M7989 Other specified soft tissue disorders: Secondary | ICD-10-CM

## 2016-12-30 DIAGNOSIS — M79606 Pain in leg, unspecified: Secondary | ICD-10-CM | POA: Insufficient documentation

## 2016-12-30 NOTE — Progress Notes (Signed)
Referring Physician:   Patient name: Francis Brewer MRN: [id number] DOB: [birth year] Sex: male  REASON FOR CONSULT: leg swelling  HPI: Francis Brewer is a 71 y.o. male with about 3 month history of leg swelling.  Patient stated his leg swelling began after having his right ankle replaced. The leg swelling is fairly symmetric in both legs. He denies any prior history of DVT. He denies any prior procedures on his circulatory system. He does have a family history of his mother having a bypass in her leg. He has no history of hypercoagulable state. He is a former smoker but quit in 1998. He recently started wearing compression stockings about 3 weeks ago but states he really hasn't noticed a difference. He states that his swelling is actually worse in the morning and gets better as the day goes on. He currently is sleeping in a recliner with his feet down due to some problems with his arms and shoulders. He is currently under evaluation by neurology for this. He did have a recent MRI which showed multilevel cervical disc disease but no nerve root impingement. He denies any symptoms of claudication or rest pain.   Past Medical History:  Diagnosis Date  . Arthritis   . Colon polyp   . GERD (gastroesophageal reflux disease)   . Hyperlipemia   . Hypertension   . Hypothyroidism   . Numbness and tingling    Past Surgical History:  Procedure Laterality Date  . ACHILLES TENDON LENGTHENING Right 10/21/2016   Procedure: ACHILLES TENDON LENGTHENING;  Surgeon: Wylene Simmer, MD;  Location: [facility];  Service: Orthopedics;  Laterality: Right;  . CHOLECYSTECTOMY    . COLONOSCOPY    . FOOT SURGERY Right    fracture  . HARDWARE REMOVAL Right 10/21/2016   Procedure: HARDWARE REMOVAL;  Surgeon: Wylene Simmer, MD;  Location: [facility];  Service: Orthopedics;  Laterality: Right;  . TOTAL ANKLE ARTHROPLASTY Right 10/21/2016   Procedure: RIGHT TOTAL ANKLE ARTHOPLASTY;  Surgeon: Wylene Simmer, MD;  Location: [facility];   Service: Orthopedics;  Laterality: Right;  requests at total of 140mins    Family History  Problem Relation Age of Onset  . Heart disease Mother   . Pneumonia Mother   . Heart attack Father     SOCIAL HISTORY: Social History   Social History  . Marital status: Married    Spouse name: N/A  . Number of children: 1  . Years of education: HS   Occupational History  . Retired    Social History Main Topics  . Smoking status: Former Smoker    Packs/day: 7.00  . Smokeless tobacco: Never Used     Comment: quit in late 1998  . Alcohol use 0.6 oz/week    1 Shots of liquor per week  . Drug use: No  . Sexual activity: Not on file   Other Topics Concern  . Not on file   Social History Narrative   Lives at home with his wife.   Right-handed.   No daily caffeine use.    Allergies  Allergen Reactions  . No Known Allergies     Current Outpatient Prescriptions  Medication Sig Dispense Refill  . aspirin EC 81 MG tablet Take 1 tablet (81 mg total) by mouth 2 (two) times daily. (Patient taking differently: Take 81 mg by mouth daily. ) 42 tablet 0  . atorvastatin (LIPITOR) 40 MG tablet Take 40 mg by mouth daily.    Marland Kitchen ezetimibe (ZETIA) 10 MG  tablet Take 5 mg by mouth daily.     Marland Kitchen gabapentin (NEURONTIN) 300 MG capsule Take 1 capsule (300 mg total) by mouth 3 (three) times daily. 90 capsule 11  . levothyroxine (SYNTHROID, LEVOTHROID) 75 MCG tablet Take 75 mcg by mouth daily before breakfast.    . Multiple Vitamin (MULTIVITAMIN WITH MINERALS) TABS tablet Take 1 tablet by mouth daily. Centrum silver    . Omega-3 Fatty Acids (FISH OIL) 1200 MG CAPS Take 1,200 mg by mouth daily. Mega Red 3    . omeprazole (PRILOSEC) 20 MG capsule Take 20 mg by mouth daily before lunch.    . traMADol (ULTRAM) 50 MG tablet Take 1 tablet (50 mg total) by mouth at bedtime as needed. 30 tablet 3  . valsartan-hydrochlorothiazide (DIOVAN-HCT) 320-25 MG tablet Take 1 tablet by mouth daily.     No current  facility-administered medications for this visit.     ROS:   General:  No weight loss, Fever, chills  HEENT: No recent headaches, no nasal bleeding, no visual changes, no sore throat  Neurologic: No dizziness, blackouts, seizures. No recent symptoms of stroke or mini- stroke. No recent episodes of slurred speech, or temporary blindness.  Cardiac: No recent episodes of chest pain/pressure, no shortness of breath at rest.  No shortness of breath with exertion.  Denies history of atrial fibrillation or irregular heartbeat  Vascular: No history of rest pain in feet.  No history of claudication.  No history of non-healing ulcer, No history of DVT   Pulmonary: No home oxygen, no productive cough, no hemoptysis,  No asthma or wheezing  Musculoskeletal:  [X]  Arthritis, [X]  Low back pain,  [X]  Joint pain  Hematologic:No history of hypercoagulable state.  No history of easy bleeding.  No history of anemia  Gastrointestinal: No hematochezia or melena,  No gastroesophageal reflux, no trouble swallowing  Urinary: [ ]  chronic Kidney disease, [ ]  on HD - [ ]  MWF or [ ]  TTHS, [ ]  Burning with urination, [ ]  Frequent urination, [ ]  Difficulty urinating;   Skin: No rashes  Psychological: No history of anxiety,  No history of depression   Physical Examination  Vitals:   12/30/16 1009  BP: 136/75  Pulse: (!) 122  Resp: 16  Temp: 97 F (36.1 C)  TempSrc: Oral  SpO2: 90%  Weight: 204 lb (92.5 kg)  Height: 5' 7.5" (1.715 m)    Body mass index is 31.48 kg/m.  General:  Alert and oriented, no acute distress HEENT: Normal Neck: No bruit or JVD Pulmonary: Clear to auscultation bilaterally Cardiac: Regular Rate and Rhythm without murmur Abdomen: Soft, non-tender, non-distended, no mass Skin: No rash Extremity Pulses:  2+ radial, brachial, femoral, absent dorsalis pedis, posterior tibial pulses bilaterally Musculoskeletal: No deformity  tense edema bilateral legs extending from the knee  down into the foot essentially nonpitting Neurologic: Upper and lower extremity motor 5/5 and symmetric  DATA:  Patient had a venous duplex exam today which showed no evidence of DVT or superficial thrombosis. There was also no evidence of deep or superficial reflux.  ASSESSMENT:  Bilateral leg swelling. Most likely this is due to dependency from his current sleeping position. There is a remote chance he could have some venous obstruction at the caval or iliac vein level but he does not really exhibited history or symptoms of an inciting event for this. He does not have palpable pulses in his feet so he probably does have at least some element of peripheral arterial disease  although I would consider this asymptomatic and not the cause of his swelling   PLAN:  #1 encouraged the patient to continue with his compression stocking therapy to improve symptoms overall. #2 patient will have bilateral ABIs performed at his next office visit in 6 months to rule out arterial occlusive disease again not the cause of his current symptoms but he certainly has risk factors for peripheral arterial disease and this could contribute #3 consideration for ascending venogram to assess his iliac veins if his symptoms persisted and are not improved after full evaluation and therapy from his neurologic workup.   Ruta Hinds, MD [facility] Office: [phone] Pager: [phone]

## 2016-12-31 DIAGNOSIS — M25571 Pain in right ankle and joints of right foot: Secondary | ICD-10-CM | POA: Diagnosis not present

## 2016-12-31 NOTE — Addendum Note (Signed)
Addended by: Lianne Cure A on: 12/31/2016 09:21 AM   Modules accepted: Orders

## 2017-01-04 DIAGNOSIS — M25571 Pain in right ankle and joints of right foot: Secondary | ICD-10-CM | POA: Diagnosis not present

## 2017-01-05 DIAGNOSIS — I10 Essential (primary) hypertension: Secondary | ICD-10-CM | POA: Diagnosis not present

## 2017-01-05 DIAGNOSIS — M2550 Pain in unspecified joint: Secondary | ICD-10-CM | POA: Diagnosis not present

## 2017-01-05 DIAGNOSIS — R600 Localized edema: Secondary | ICD-10-CM | POA: Diagnosis not present

## 2017-01-05 DIAGNOSIS — R0609 Other forms of dyspnea: Secondary | ICD-10-CM | POA: Diagnosis not present

## 2017-01-07 DIAGNOSIS — Z96661 Presence of right artificial ankle joint: Secondary | ICD-10-CM | POA: Diagnosis not present

## 2017-01-07 DIAGNOSIS — R600 Localized edema: Secondary | ICD-10-CM | POA: Diagnosis not present

## 2017-01-07 DIAGNOSIS — Z4789 Encounter for other orthopedic aftercare: Secondary | ICD-10-CM | POA: Diagnosis not present

## 2017-01-07 DIAGNOSIS — Z471 Aftercare following joint replacement surgery: Secondary | ICD-10-CM | POA: Diagnosis not present

## 2017-01-10 DIAGNOSIS — R609 Edema, unspecified: Secondary | ICD-10-CM | POA: Diagnosis not present

## 2017-01-10 DIAGNOSIS — E668 Other obesity: Secondary | ICD-10-CM | POA: Diagnosis not present

## 2017-01-10 DIAGNOSIS — I6529 Occlusion and stenosis of unspecified carotid artery: Secondary | ICD-10-CM | POA: Diagnosis not present

## 2017-01-10 DIAGNOSIS — N289 Disorder of kidney and ureter, unspecified: Secondary | ICD-10-CM | POA: Diagnosis not present

## 2017-01-10 DIAGNOSIS — R9431 Abnormal electrocardiogram [ECG] [EKG]: Secondary | ICD-10-CM | POA: Diagnosis not present

## 2017-01-10 DIAGNOSIS — R600 Localized edema: Secondary | ICD-10-CM | POA: Diagnosis not present

## 2017-01-10 DIAGNOSIS — K219 Gastro-esophageal reflux disease without esophagitis: Secondary | ICD-10-CM | POA: Diagnosis not present

## 2017-01-10 DIAGNOSIS — E039 Hypothyroidism, unspecified: Secondary | ICD-10-CM | POA: Diagnosis not present

## 2017-01-10 DIAGNOSIS — I10 Essential (primary) hypertension: Secondary | ICD-10-CM | POA: Diagnosis not present

## 2017-01-10 DIAGNOSIS — R011 Cardiac murmur, unspecified: Secondary | ICD-10-CM | POA: Diagnosis not present

## 2017-01-10 DIAGNOSIS — E785 Hyperlipidemia, unspecified: Secondary | ICD-10-CM | POA: Diagnosis not present

## 2017-01-11 DIAGNOSIS — R700 Elevated erythrocyte sedimentation rate: Secondary | ICD-10-CM | POA: Diagnosis not present

## 2017-01-11 DIAGNOSIS — R202 Paresthesia of skin: Secondary | ICD-10-CM | POA: Diagnosis not present

## 2017-01-11 DIAGNOSIS — M2560 Stiffness of unspecified joint, not elsewhere classified: Secondary | ICD-10-CM | POA: Diagnosis not present

## 2017-01-11 DIAGNOSIS — M25519 Pain in unspecified shoulder: Secondary | ICD-10-CM | POA: Diagnosis not present

## 2017-01-12 DIAGNOSIS — M25571 Pain in right ankle and joints of right foot: Secondary | ICD-10-CM | POA: Diagnosis not present

## 2017-01-14 DIAGNOSIS — N289 Disorder of kidney and ureter, unspecified: Secondary | ICD-10-CM | POA: Diagnosis not present

## 2017-01-17 DIAGNOSIS — M25571 Pain in right ankle and joints of right foot: Secondary | ICD-10-CM | POA: Diagnosis not present

## 2017-01-18 DIAGNOSIS — M1990 Unspecified osteoarthritis, unspecified site: Secondary | ICD-10-CM | POA: Diagnosis not present

## 2017-01-18 DIAGNOSIS — R202 Paresthesia of skin: Secondary | ICD-10-CM | POA: Diagnosis not present

## 2017-01-18 DIAGNOSIS — M25519 Pain in unspecified shoulder: Secondary | ICD-10-CM | POA: Diagnosis not present

## 2017-01-18 DIAGNOSIS — R600 Localized edema: Secondary | ICD-10-CM | POA: Diagnosis not present

## 2017-01-19 DIAGNOSIS — Z6832 Body mass index (BMI) 32.0-32.9, adult: Secondary | ICD-10-CM | POA: Diagnosis not present

## 2017-01-19 DIAGNOSIS — I10 Essential (primary) hypertension: Secondary | ICD-10-CM | POA: Diagnosis not present

## 2017-01-19 DIAGNOSIS — N289 Disorder of kidney and ureter, unspecified: Secondary | ICD-10-CM | POA: Diagnosis not present

## 2017-01-19 DIAGNOSIS — R600 Localized edema: Secondary | ICD-10-CM | POA: Diagnosis not present

## 2017-01-20 DIAGNOSIS — M25571 Pain in right ankle and joints of right foot: Secondary | ICD-10-CM | POA: Diagnosis not present

## 2017-01-21 ENCOUNTER — Encounter (INDEPENDENT_AMBULATORY_CARE_PROVIDER_SITE_OTHER): Payer: Self-pay

## 2017-01-21 ENCOUNTER — Ambulatory Visit (INDEPENDENT_AMBULATORY_CARE_PROVIDER_SITE_OTHER): Payer: Medicare Other | Admitting: Neurology

## 2017-01-21 DIAGNOSIS — M542 Cervicalgia: Secondary | ICD-10-CM

## 2017-01-21 DIAGNOSIS — R202 Paresthesia of skin: Secondary | ICD-10-CM

## 2017-01-21 DIAGNOSIS — M791 Myalgia, unspecified site: Secondary | ICD-10-CM

## 2017-01-21 DIAGNOSIS — Z96661 Presence of right artificial ankle joint: Secondary | ICD-10-CM | POA: Diagnosis not present

## 2017-01-21 NOTE — Procedures (Signed)
Full Name: Francis Brewer Gender: Male MRN #: [id number] Date of Birth: 11/30/1945    Visit Date: 01/21/2017 09:59 Age: 71 Years 82 Months Old Examining Physician: Marcial Pacas, MD  Referring Physician: Krista Blue, MD History: 71 years old right-handed male with few months history of neck pain radiating pain to bilateral shoulder, bilateral hands paresthesia  Summary of the test:  Nerve conduction study:  Left median sensory response was moderately prolonged, with mildly decreased snap amplitude. Right median sensory responses were absent.  Bilateral ulnar sensory and motor responses were normal. Bilateral ulnar well F wave latency was within normal limit.  Bilateral median motor responses showed moderately to severely prolonged distal latency, with normal C map amplitude, conduction velocity.  Electromyography: Selective needle examinations were performed at bilateral upper extremity muscles, bilateral cervical paraspinal muscles.  The only abnormality is mildly decreased recruitment of bilateral abductor pollicis brevis, there is no evidence of active denervation.  Conclusion: This is an abnormal study. There is electrodiagnostic evidence of bilateral median neuropathy across the wrist, consistent with moderate bilateral carpal tunnel syndromes, there is no evidence of axonal loss. There is no evidence of bilateral cervical radiculopathy.    ------------------------------- Marcial Pacas, M.D.  [facility] [place], [place] [address] Tel: [phone] Fax: [phone]        [facility]    Nerve / Sites Muscle Latency Ref. Amplitude Ref. Rel Amp Segments Distance Velocity Ref. Area    ms ms mV mV %  cm m/s m/s mVms  L Median - APB     Wrist APB 8.7 ?4.4 4.5 ?4.0 100 Wrist - APB 7   12.8     Upper arm APB 13.5  4.5  101 Upper arm - Wrist 25 52 ?49 13.0  R Median - APB     Wrist APB 7.3 ?4.4 6.1 ?4.0 100 Wrist - APB 7   18.7     Upper arm APB 12.1  6.0   98.2 Upper arm - Wrist 25 52 ?49 19.1  L Ulnar - ADM     Wrist ADM 2.9 ?3.3 9.1 ?6.0 100 Wrist - ADM 7   32.5     B.Elbow ADM 6.2  7.2  78.5 B.Elbow - Wrist 18 54 ?49 26.3     A.Elbow ADM 8.9  7.2  101 A.Elbow - B.Elbow 12 45 ?49 30.0         A.Elbow - Wrist      R Ulnar - ADM     Wrist ADM 2.8 ?3.3 9.0 ?6.0 100 Wrist - ADM 7   30.7     B.Elbow ADM 6.4  7.9  88.6 B.Elbow - Wrist 18 50 ?49 27.2     A.Elbow ADM 8.6  8.2  103 A.Elbow - B.Elbow 10 45 ?49 27.5         A.Elbow - Wrist                 SNC    Nerve / Sites Rec. Site Peak Lat Ref.  Amp Ref. Segments Distance    ms ms V V  cm  L Median - Orthodromic (Dig II, Mid palm)     Dig II Wrist 4.8 ?3.4 5 ?10 Dig II - Wrist 13  R Median - Orthodromic (Dig II, Mid palm)     Dig II Wrist NR ?3.4 NR ?10 Dig II - Wrist 13  L Ulnar - Orthodromic, (Dig V, Mid palm)  Dig V Wrist 2.7 ?3.1 6 ?5 Dig V - Wrist 11  R Ulnar - Orthodromic, (Dig V, Mid palm)     Dig V Wrist 3.0 ?3.1 6 ?5 Dig V - Wrist 80             F  Wave    Nerve F Lat Ref.   ms ms  L Ulnar - ADM 30.1 ?32.0  R Ulnar - ADM 30.4 ?32.0         EMG full       EMG Summary Table    Spontaneous MUAP Recruitment  Muscle IA Fib PSW Fasc Other Amp Dur. Poly Pattern  R. First dorsal interosseous Normal None None None _______ Normal Normal Normal Normal  R. Abductor pollicis brevis Increased None None None _______ Normal Normal Normal Reduced  R. Pronator teres Normal None None None _______ Normal Normal Normal Normal  R. Biceps brachii Normal None None None _______ Normal Normal Normal Normal  R. Deltoid Normal None None None _______ Normal Normal Normal Normal  R. Triceps brachii Normal None None None _______ Normal Normal Normal Normal  R. Flexor carpi ulnaris Normal None None None _______ Normal Normal Normal Normal  R. Extensor digitorum communis Normal None None None _______ Normal Normal Normal Normal  L. Abductor pollicis brevis Increased None None None _______ Normal  Normal Normal Reduced  L. First dorsal interosseous Normal None None None _______ Normal Normal Normal Normal  L. Pronator teres Normal None None None _______ Normal Normal Normal Normal  L. Biceps brachii Normal None None None _______ Normal Normal Normal Normal  L. Deltoid Normal None None None _______ Normal Normal Normal Normal  L. Cervical paraspinals Normal None None None _______ Normal Normal Normal Normal  R. Cervical paraspinals Normal None None None _______ Normal Normal Normal Normal
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
## 2017-01-22 LAB — VITAMIN B12: VITAMIN B 12: 1094 pg/mL (ref 232–1245)

## 2017-01-22 LAB — C-REACTIVE PROTEIN: CRP: 2.9 mg/L (ref 0.0–4.9)

## 2017-01-22 LAB — HEMOGLOBIN A1C
ESTIMATED AVERAGE GLUCOSE: 105 mg/dL
Hgb A1c MFr Bld: 5.3 % (ref 4.8–5.6)

## 2017-01-22 LAB — ANA W/REFLEX: Anti Nuclear Antibody(ANA): NEGATIVE

## 2017-01-22 LAB — CK: Total CK: 35 U/L (ref 24–204)

## 2017-01-22 LAB — VITAMIN D 25 HYDROXY (VIT D DEFICIENCY, FRACTURES): VIT D 25 HYDROXY: 37.9 ng/mL (ref 30.0–100.0)

## 2017-01-22 LAB — SEDIMENTATION RATE: SED RATE: 29 mm/h (ref 0–30)

## 2017-01-24 DIAGNOSIS — M25571 Pain in right ankle and joints of right foot: Secondary | ICD-10-CM | POA: Diagnosis not present

## 2017-01-27 DIAGNOSIS — E876 Hypokalemia: Secondary | ICD-10-CM | POA: Diagnosis not present

## 2017-01-27 DIAGNOSIS — N289 Disorder of kidney and ureter, unspecified: Secondary | ICD-10-CM | POA: Diagnosis not present

## 2017-01-27 DIAGNOSIS — R600 Localized edema: Secondary | ICD-10-CM | POA: Diagnosis not present

## 2017-01-27 DIAGNOSIS — I10 Essential (primary) hypertension: Secondary | ICD-10-CM | POA: Diagnosis not present

## 2017-01-31 DIAGNOSIS — M25571 Pain in right ankle and joints of right foot: Secondary | ICD-10-CM | POA: Diagnosis not present

## 2017-02-01 DIAGNOSIS — M25519 Pain in unspecified shoulder: Secondary | ICD-10-CM | POA: Diagnosis not present

## 2017-02-01 DIAGNOSIS — R600 Localized edema: Secondary | ICD-10-CM | POA: Diagnosis not present

## 2017-02-01 DIAGNOSIS — M1990 Unspecified osteoarthritis, unspecified site: Secondary | ICD-10-CM | POA: Diagnosis not present

## 2017-02-01 DIAGNOSIS — R202 Paresthesia of skin: Secondary | ICD-10-CM | POA: Diagnosis not present

## 2017-02-02 DIAGNOSIS — M810 Age-related osteoporosis without current pathological fracture: Secondary | ICD-10-CM | POA: Diagnosis not present

## 2017-02-03 DIAGNOSIS — M25571 Pain in right ankle and joints of right foot: Secondary | ICD-10-CM | POA: Diagnosis not present

## 2017-02-07 DIAGNOSIS — H25099 Other age-related incipient cataract, unspecified eye: Secondary | ICD-10-CM | POA: Diagnosis not present

## 2017-02-07 DIAGNOSIS — H5203 Hypermetropia, bilateral: Secondary | ICD-10-CM | POA: Diagnosis not present

## 2017-02-07 DIAGNOSIS — M25571 Pain in right ankle and joints of right foot: Secondary | ICD-10-CM | POA: Diagnosis not present

## 2017-02-10 DIAGNOSIS — M25571 Pain in right ankle and joints of right foot: Secondary | ICD-10-CM | POA: Diagnosis not present

## 2017-02-11 DIAGNOSIS — R600 Localized edema: Secondary | ICD-10-CM | POA: Diagnosis not present

## 2017-02-11 DIAGNOSIS — I10 Essential (primary) hypertension: Secondary | ICD-10-CM | POA: Diagnosis not present

## 2017-02-25 NOTE — Addendum Note (Signed)
Addendum  created 02/25/17 1000 by Rica Koyanagi, MD   Sign clinical note

## 2017-03-15 DIAGNOSIS — M1990 Unspecified osteoarthritis, unspecified site: Secondary | ICD-10-CM | POA: Diagnosis not present

## 2017-03-15 DIAGNOSIS — R202 Paresthesia of skin: Secondary | ICD-10-CM | POA: Diagnosis not present

## 2017-03-15 DIAGNOSIS — M353 Polymyalgia rheumatica: Secondary | ICD-10-CM | POA: Diagnosis not present

## 2017-03-15 DIAGNOSIS — M810 Age-related osteoporosis without current pathological fracture: Secondary | ICD-10-CM | POA: Diagnosis not present

## 2017-04-11 DIAGNOSIS — Z96661 Presence of right artificial ankle joint: Secondary | ICD-10-CM | POA: Diagnosis not present

## 2017-04-11 DIAGNOSIS — Z471 Aftercare following joint replacement surgery: Secondary | ICD-10-CM | POA: Diagnosis not present

## 2017-04-15 DIAGNOSIS — M810 Age-related osteoporosis without current pathological fracture: Secondary | ICD-10-CM | POA: Diagnosis not present

## 2017-04-19 DIAGNOSIS — R9431 Abnormal electrocardiogram [ECG] [EKG]: Secondary | ICD-10-CM | POA: Diagnosis not present

## 2017-04-19 DIAGNOSIS — I6529 Occlusion and stenosis of unspecified carotid artery: Secondary | ICD-10-CM | POA: Diagnosis not present

## 2017-04-19 DIAGNOSIS — R609 Edema, unspecified: Secondary | ICD-10-CM | POA: Diagnosis not present

## 2017-04-19 DIAGNOSIS — E785 Hyperlipidemia, unspecified: Secondary | ICD-10-CM | POA: Diagnosis not present

## 2017-04-19 DIAGNOSIS — E668 Other obesity: Secondary | ICD-10-CM | POA: Diagnosis not present

## 2017-04-19 DIAGNOSIS — K219 Gastro-esophageal reflux disease without esophagitis: Secondary | ICD-10-CM | POA: Diagnosis not present

## 2017-04-19 DIAGNOSIS — E039 Hypothyroidism, unspecified: Secondary | ICD-10-CM | POA: Diagnosis not present

## 2017-04-19 DIAGNOSIS — R011 Cardiac murmur, unspecified: Secondary | ICD-10-CM | POA: Diagnosis not present

## 2017-04-19 DIAGNOSIS — I119 Hypertensive heart disease without heart failure: Secondary | ICD-10-CM | POA: Diagnosis not present

## 2017-04-26 DIAGNOSIS — M810 Age-related osteoporosis without current pathological fracture: Secondary | ICD-10-CM | POA: Diagnosis not present

## 2017-04-26 DIAGNOSIS — M353 Polymyalgia rheumatica: Secondary | ICD-10-CM | POA: Diagnosis not present

## 2017-04-26 DIAGNOSIS — M1990 Unspecified osteoarthritis, unspecified site: Secondary | ICD-10-CM | POA: Diagnosis not present

## 2017-04-26 DIAGNOSIS — M25519 Pain in unspecified shoulder: Secondary | ICD-10-CM | POA: Diagnosis not present

## 2017-06-23 DIAGNOSIS — Z0000 Encounter for general adult medical examination without abnormal findings: Secondary | ICD-10-CM | POA: Diagnosis not present

## 2017-06-23 DIAGNOSIS — Z23 Encounter for immunization: Secondary | ICD-10-CM | POA: Diagnosis not present

## 2017-06-23 DIAGNOSIS — Z125 Encounter for screening for malignant neoplasm of prostate: Secondary | ICD-10-CM | POA: Diagnosis not present

## 2017-06-23 DIAGNOSIS — E7800 Pure hypercholesterolemia, unspecified: Secondary | ICD-10-CM | POA: Diagnosis not present

## 2017-06-23 DIAGNOSIS — E039 Hypothyroidism, unspecified: Secondary | ICD-10-CM | POA: Diagnosis not present

## 2017-06-23 DIAGNOSIS — I10 Essential (primary) hypertension: Secondary | ICD-10-CM | POA: Diagnosis not present

## 2017-06-30 DIAGNOSIS — R600 Localized edema: Secondary | ICD-10-CM | POA: Diagnosis not present

## 2017-06-30 DIAGNOSIS — Z1212 Encounter for screening for malignant neoplasm of rectum: Secondary | ICD-10-CM | POA: Diagnosis not present

## 2017-06-30 DIAGNOSIS — N289 Disorder of kidney and ureter, unspecified: Secondary | ICD-10-CM | POA: Diagnosis not present

## 2017-06-30 DIAGNOSIS — D649 Anemia, unspecified: Secondary | ICD-10-CM | POA: Diagnosis not present

## 2017-06-30 DIAGNOSIS — H9313 Tinnitus, bilateral: Secondary | ICD-10-CM | POA: Diagnosis not present

## 2017-06-30 DIAGNOSIS — K219 Gastro-esophageal reflux disease without esophagitis: Secondary | ICD-10-CM | POA: Diagnosis not present

## 2017-06-30 DIAGNOSIS — R9431 Abnormal electrocardiogram [ECG] [EKG]: Secondary | ICD-10-CM | POA: Diagnosis not present

## 2017-06-30 DIAGNOSIS — I6529 Occlusion and stenosis of unspecified carotid artery: Secondary | ICD-10-CM | POA: Diagnosis not present

## 2017-06-30 DIAGNOSIS — E039 Hypothyroidism, unspecified: Secondary | ICD-10-CM | POA: Diagnosis not present

## 2017-06-30 DIAGNOSIS — B351 Tinea unguium: Secondary | ICD-10-CM | POA: Diagnosis not present

## 2017-06-30 DIAGNOSIS — R0609 Other forms of dyspnea: Secondary | ICD-10-CM | POA: Diagnosis not present

## 2017-06-30 DIAGNOSIS — I10 Essential (primary) hypertension: Secondary | ICD-10-CM | POA: Diagnosis not present

## 2017-06-30 DIAGNOSIS — E7800 Pure hypercholesterolemia, unspecified: Secondary | ICD-10-CM | POA: Diagnosis not present

## 2017-07-07 ENCOUNTER — Ambulatory Visit (HOSPITAL_COMMUNITY)
Admission: RE | Admit: 2017-07-07 | Discharge: 2017-07-07 | Disposition: A | Discharge status: Home / Self Care | Payer: Medicare Other | Source: Ambulatory Visit | Attending: Vascular Surgery | Admitting: Vascular Surgery

## 2017-07-07 ENCOUNTER — Encounter: Payer: Self-pay | Admitting: Vascular Surgery

## 2017-07-07 ENCOUNTER — Ambulatory Visit (INDEPENDENT_AMBULATORY_CARE_PROVIDER_SITE_OTHER): Payer: Medicare Other | Admitting: Vascular Surgery

## 2017-07-07 VITALS — BP 111/75 | HR 59 | Temp 97.4°F | Resp 16 | Ht 67.5 in | Wt 209.0 lb

## 2017-07-07 DIAGNOSIS — Z87891 Personal history of nicotine dependence: Secondary | ICD-10-CM | POA: Insufficient documentation

## 2017-07-07 DIAGNOSIS — E785 Hyperlipidemia, unspecified: Secondary | ICD-10-CM | POA: Diagnosis not present

## 2017-07-07 DIAGNOSIS — R2243 Localized swelling, mass and lump, lower limb, bilateral: Secondary | ICD-10-CM

## 2017-07-07 DIAGNOSIS — E119 Type 2 diabetes mellitus without complications: Secondary | ICD-10-CM | POA: Insufficient documentation

## 2017-07-07 DIAGNOSIS — M7989 Other specified soft tissue disorders: Secondary | ICD-10-CM | POA: Diagnosis not present

## 2017-07-07 DIAGNOSIS — I10 Essential (primary) hypertension: Secondary | ICD-10-CM | POA: Insufficient documentation

## 2017-07-07 NOTE — Progress Notes (Signed)
Patient is a 71 year old male who returns for follow-up today. He was initially evaluated in April 2018 for bilateral lower extremity leg swelling. He was prescribed compression stockings at that point. He had no evidence of superficial or deep reflux. He states that his leg swelling symptoms have resolved. He has not really been wearing his compression stockings lately I encouraged him to continue use of these as long as they are not cumbersome to him.  He denies any claudication symptoms.  Review of systems: He still has some occasional right shoulder and right ankle pain from degenerative joint disease but this has been overall improved. He denies shortness of breath. He denies chest pain.  Physical exam:  Vitals:   07/07/17 0936  BP: 111/75  Pulse: (!) 59  Resp: 16  Temp: (!) 97.4 F (36.3 C)  SpO2: 99%  Weight: 209 lb (94.8 kg)  Height: 5' 7.5" (1.715 m)    Extremities: 2+ dorsalis pedis pulses absent right posterior tibial pulse 2+ left posterior tibial pulse trace ankle edema bilaterally  Chest: Clear to auscultation bilaterally  Neck: No carotid bruits  Cardiac: Regular rate and rhythm without murmur  Data: Patient had bilateral ABIs today which were greater than 1 triphasic and normal bilaterally. I reviewed and interpreted this study. Prior vein reflux study shows no superficial or deep vein reflux  Assessment: Leg swelling resolved no evidence of peripheral arterial disease on noninvasive or clinical exam today.  Plan: The patient will follow-up on as-needed basis if he has recurrence of symptoms.  Ruta Hinds, MD [facility] Office: [phone] Pager: [phone]

## 2017-07-11 DIAGNOSIS — H9313 Tinnitus, bilateral: Secondary | ICD-10-CM | POA: Insufficient documentation

## 2017-07-11 DIAGNOSIS — J310 Chronic rhinitis: Secondary | ICD-10-CM | POA: Insufficient documentation

## 2017-07-11 DIAGNOSIS — H903 Sensorineural hearing loss, bilateral: Secondary | ICD-10-CM | POA: Diagnosis not present

## 2017-07-14 DIAGNOSIS — I6529 Occlusion and stenosis of unspecified carotid artery: Secondary | ICD-10-CM | POA: Diagnosis not present

## 2017-07-26 DIAGNOSIS — M810 Age-related osteoporosis without current pathological fracture: Secondary | ICD-10-CM | POA: Diagnosis not present

## 2017-07-26 DIAGNOSIS — G5600 Carpal tunnel syndrome, unspecified upper limb: Secondary | ICD-10-CM | POA: Diagnosis not present

## 2017-07-26 DIAGNOSIS — M25519 Pain in unspecified shoulder: Secondary | ICD-10-CM | POA: Diagnosis not present

## 2017-07-26 DIAGNOSIS — M353 Polymyalgia rheumatica: Secondary | ICD-10-CM | POA: Diagnosis not present

## 2017-07-26 DIAGNOSIS — Z7952 Long term (current) use of systemic steroids: Secondary | ICD-10-CM | POA: Diagnosis not present

## 2017-07-26 DIAGNOSIS — M1990 Unspecified osteoarthritis, unspecified site: Secondary | ICD-10-CM | POA: Diagnosis not present

## 2017-10-20 DIAGNOSIS — M810 Age-related osteoporosis without current pathological fracture: Secondary | ICD-10-CM | POA: Diagnosis not present

## 2017-10-26 DIAGNOSIS — Z7952 Long term (current) use of systemic steroids: Secondary | ICD-10-CM | POA: Diagnosis not present

## 2017-10-26 DIAGNOSIS — M353 Polymyalgia rheumatica: Secondary | ICD-10-CM | POA: Diagnosis not present

## 2017-10-26 DIAGNOSIS — M810 Age-related osteoporosis without current pathological fracture: Secondary | ICD-10-CM | POA: Diagnosis not present

## 2017-10-26 DIAGNOSIS — M1990 Unspecified osteoarthritis, unspecified site: Secondary | ICD-10-CM | POA: Diagnosis not present

## 2017-10-26 DIAGNOSIS — G5600 Carpal tunnel syndrome, unspecified upper limb: Secondary | ICD-10-CM | POA: Diagnosis not present

## 2017-10-26 DIAGNOSIS — M25519 Pain in unspecified shoulder: Secondary | ICD-10-CM | POA: Diagnosis not present

## 2017-11-02 DIAGNOSIS — Z6833 Body mass index (BMI) 33.0-33.9, adult: Secondary | ICD-10-CM | POA: Diagnosis not present

## 2017-11-21 DIAGNOSIS — M2042 Other hammer toe(s) (acquired), left foot: Secondary | ICD-10-CM | POA: Diagnosis not present

## 2017-11-21 DIAGNOSIS — M7752 Other enthesopathy of left foot: Secondary | ICD-10-CM | POA: Diagnosis not present

## 2017-11-21 DIAGNOSIS — M79672 Pain in left foot: Secondary | ICD-10-CM | POA: Diagnosis not present

## 2017-11-21 DIAGNOSIS — M25572 Pain in left ankle and joints of left foot: Secondary | ICD-10-CM | POA: Diagnosis not present

## 2017-12-07 DIAGNOSIS — M25572 Pain in left ankle and joints of left foot: Secondary | ICD-10-CM | POA: Diagnosis not present

## 2017-12-07 DIAGNOSIS — M7752 Other enthesopathy of left foot: Secondary | ICD-10-CM | POA: Diagnosis not present

## 2018-01-24 DIAGNOSIS — G5600 Carpal tunnel syndrome, unspecified upper limb: Secondary | ICD-10-CM | POA: Diagnosis not present

## 2018-01-24 DIAGNOSIS — M1990 Unspecified osteoarthritis, unspecified site: Secondary | ICD-10-CM | POA: Diagnosis not present

## 2018-01-24 DIAGNOSIS — M810 Age-related osteoporosis without current pathological fracture: Secondary | ICD-10-CM | POA: Diagnosis not present

## 2018-01-24 DIAGNOSIS — Z7952 Long term (current) use of systemic steroids: Secondary | ICD-10-CM | POA: Diagnosis not present

## 2018-01-24 DIAGNOSIS — M25519 Pain in unspecified shoulder: Secondary | ICD-10-CM | POA: Diagnosis not present

## 2018-01-24 DIAGNOSIS — M353 Polymyalgia rheumatica: Secondary | ICD-10-CM | POA: Diagnosis not present

## 2018-03-07 DIAGNOSIS — R05 Cough: Secondary | ICD-10-CM | POA: Diagnosis not present

## 2018-03-07 DIAGNOSIS — R0982 Postnasal drip: Secondary | ICD-10-CM | POA: Diagnosis not present

## 2018-03-07 DIAGNOSIS — J069 Acute upper respiratory infection, unspecified: Secondary | ICD-10-CM | POA: Diagnosis not present

## 2018-03-07 DIAGNOSIS — J3489 Other specified disorders of nose and nasal sinuses: Secondary | ICD-10-CM | POA: Diagnosis not present

## 2018-03-16 DIAGNOSIS — M9903 Segmental and somatic dysfunction of lumbar region: Secondary | ICD-10-CM | POA: Diagnosis not present

## 2018-03-16 DIAGNOSIS — M5135 Other intervertebral disc degeneration, thoracolumbar region: Secondary | ICD-10-CM | POA: Diagnosis not present

## 2018-03-16 DIAGNOSIS — M9902 Segmental and somatic dysfunction of thoracic region: Secondary | ICD-10-CM | POA: Diagnosis not present

## 2018-03-16 DIAGNOSIS — M9904 Segmental and somatic dysfunction of sacral region: Secondary | ICD-10-CM | POA: Diagnosis not present

## 2018-03-16 DIAGNOSIS — M5137 Other intervertebral disc degeneration, lumbosacral region: Secondary | ICD-10-CM | POA: Diagnosis not present

## 2018-03-16 DIAGNOSIS — M5136 Other intervertebral disc degeneration, lumbar region: Secondary | ICD-10-CM | POA: Diagnosis not present

## 2018-03-18 DIAGNOSIS — M5136 Other intervertebral disc degeneration, lumbar region: Secondary | ICD-10-CM | POA: Diagnosis not present

## 2018-03-18 DIAGNOSIS — M5137 Other intervertebral disc degeneration, lumbosacral region: Secondary | ICD-10-CM | POA: Diagnosis not present

## 2018-03-18 DIAGNOSIS — M5135 Other intervertebral disc degeneration, thoracolumbar region: Secondary | ICD-10-CM | POA: Diagnosis not present

## 2018-03-18 DIAGNOSIS — M9903 Segmental and somatic dysfunction of lumbar region: Secondary | ICD-10-CM | POA: Diagnosis not present

## 2018-03-18 DIAGNOSIS — M9904 Segmental and somatic dysfunction of sacral region: Secondary | ICD-10-CM | POA: Diagnosis not present

## 2018-03-18 DIAGNOSIS — M9902 Segmental and somatic dysfunction of thoracic region: Secondary | ICD-10-CM | POA: Diagnosis not present

## 2018-03-21 DIAGNOSIS — M5137 Other intervertebral disc degeneration, lumbosacral region: Secondary | ICD-10-CM | POA: Diagnosis not present

## 2018-03-21 DIAGNOSIS — M9902 Segmental and somatic dysfunction of thoracic region: Secondary | ICD-10-CM | POA: Diagnosis not present

## 2018-03-21 DIAGNOSIS — M9903 Segmental and somatic dysfunction of lumbar region: Secondary | ICD-10-CM | POA: Diagnosis not present

## 2018-03-21 DIAGNOSIS — M9904 Segmental and somatic dysfunction of sacral region: Secondary | ICD-10-CM | POA: Diagnosis not present

## 2018-03-21 DIAGNOSIS — M5136 Other intervertebral disc degeneration, lumbar region: Secondary | ICD-10-CM | POA: Diagnosis not present

## 2018-03-21 DIAGNOSIS — M5135 Other intervertebral disc degeneration, thoracolumbar region: Secondary | ICD-10-CM | POA: Diagnosis not present

## 2018-03-23 DIAGNOSIS — M9902 Segmental and somatic dysfunction of thoracic region: Secondary | ICD-10-CM | POA: Diagnosis not present

## 2018-03-23 DIAGNOSIS — M9904 Segmental and somatic dysfunction of sacral region: Secondary | ICD-10-CM | POA: Diagnosis not present

## 2018-03-23 DIAGNOSIS — M5137 Other intervertebral disc degeneration, lumbosacral region: Secondary | ICD-10-CM | POA: Diagnosis not present

## 2018-03-23 DIAGNOSIS — M5135 Other intervertebral disc degeneration, thoracolumbar region: Secondary | ICD-10-CM | POA: Diagnosis not present

## 2018-03-23 DIAGNOSIS — M5136 Other intervertebral disc degeneration, lumbar region: Secondary | ICD-10-CM | POA: Diagnosis not present

## 2018-03-23 DIAGNOSIS — M9903 Segmental and somatic dysfunction of lumbar region: Secondary | ICD-10-CM | POA: Diagnosis not present

## 2018-03-27 DIAGNOSIS — M9903 Segmental and somatic dysfunction of lumbar region: Secondary | ICD-10-CM | POA: Diagnosis not present

## 2018-03-27 DIAGNOSIS — M5137 Other intervertebral disc degeneration, lumbosacral region: Secondary | ICD-10-CM | POA: Diagnosis not present

## 2018-03-27 DIAGNOSIS — M5135 Other intervertebral disc degeneration, thoracolumbar region: Secondary | ICD-10-CM | POA: Diagnosis not present

## 2018-03-27 DIAGNOSIS — M9904 Segmental and somatic dysfunction of sacral region: Secondary | ICD-10-CM | POA: Diagnosis not present

## 2018-03-27 DIAGNOSIS — M9902 Segmental and somatic dysfunction of thoracic region: Secondary | ICD-10-CM | POA: Diagnosis not present

## 2018-03-27 DIAGNOSIS — M5136 Other intervertebral disc degeneration, lumbar region: Secondary | ICD-10-CM | POA: Diagnosis not present

## 2018-03-29 DIAGNOSIS — M9902 Segmental and somatic dysfunction of thoracic region: Secondary | ICD-10-CM | POA: Diagnosis not present

## 2018-03-29 DIAGNOSIS — M9904 Segmental and somatic dysfunction of sacral region: Secondary | ICD-10-CM | POA: Diagnosis not present

## 2018-03-29 DIAGNOSIS — M5135 Other intervertebral disc degeneration, thoracolumbar region: Secondary | ICD-10-CM | POA: Diagnosis not present

## 2018-03-29 DIAGNOSIS — M5136 Other intervertebral disc degeneration, lumbar region: Secondary | ICD-10-CM | POA: Diagnosis not present

## 2018-03-29 DIAGNOSIS — M9903 Segmental and somatic dysfunction of lumbar region: Secondary | ICD-10-CM | POA: Diagnosis not present

## 2018-03-29 DIAGNOSIS — M5137 Other intervertebral disc degeneration, lumbosacral region: Secondary | ICD-10-CM | POA: Diagnosis not present

## 2018-04-03 DIAGNOSIS — M5137 Other intervertebral disc degeneration, lumbosacral region: Secondary | ICD-10-CM | POA: Diagnosis not present

## 2018-04-03 DIAGNOSIS — M5135 Other intervertebral disc degeneration, thoracolumbar region: Secondary | ICD-10-CM | POA: Diagnosis not present

## 2018-04-03 DIAGNOSIS — M9902 Segmental and somatic dysfunction of thoracic region: Secondary | ICD-10-CM | POA: Diagnosis not present

## 2018-04-03 DIAGNOSIS — M9904 Segmental and somatic dysfunction of sacral region: Secondary | ICD-10-CM | POA: Diagnosis not present

## 2018-04-03 DIAGNOSIS — M9903 Segmental and somatic dysfunction of lumbar region: Secondary | ICD-10-CM | POA: Diagnosis not present

## 2018-04-03 DIAGNOSIS — M5136 Other intervertebral disc degeneration, lumbar region: Secondary | ICD-10-CM | POA: Diagnosis not present

## 2018-04-05 DIAGNOSIS — M5137 Other intervertebral disc degeneration, lumbosacral region: Secondary | ICD-10-CM | POA: Diagnosis not present

## 2018-04-05 DIAGNOSIS — M9902 Segmental and somatic dysfunction of thoracic region: Secondary | ICD-10-CM | POA: Diagnosis not present

## 2018-04-05 DIAGNOSIS — M9903 Segmental and somatic dysfunction of lumbar region: Secondary | ICD-10-CM | POA: Diagnosis not present

## 2018-04-05 DIAGNOSIS — M5135 Other intervertebral disc degeneration, thoracolumbar region: Secondary | ICD-10-CM | POA: Diagnosis not present

## 2018-04-05 DIAGNOSIS — M5136 Other intervertebral disc degeneration, lumbar region: Secondary | ICD-10-CM | POA: Diagnosis not present

## 2018-04-05 DIAGNOSIS — M9904 Segmental and somatic dysfunction of sacral region: Secondary | ICD-10-CM | POA: Diagnosis not present

## 2018-04-24 DIAGNOSIS — M810 Age-related osteoporosis without current pathological fracture: Secondary | ICD-10-CM | POA: Diagnosis not present

## 2018-04-25 DIAGNOSIS — H25099 Other age-related incipient cataract, unspecified eye: Secondary | ICD-10-CM | POA: Diagnosis not present

## 2018-04-25 DIAGNOSIS — H5203 Hypermetropia, bilateral: Secondary | ICD-10-CM | POA: Diagnosis not present

## 2018-04-25 DIAGNOSIS — I10 Essential (primary) hypertension: Secondary | ICD-10-CM | POA: Diagnosis not present

## 2018-04-25 DIAGNOSIS — H524 Presbyopia: Secondary | ICD-10-CM | POA: Diagnosis not present

## 2018-04-25 DIAGNOSIS — H52223 Regular astigmatism, bilateral: Secondary | ICD-10-CM | POA: Diagnosis not present

## 2018-05-03 DIAGNOSIS — M5137 Other intervertebral disc degeneration, lumbosacral region: Secondary | ICD-10-CM | POA: Diagnosis not present

## 2018-05-03 DIAGNOSIS — M9903 Segmental and somatic dysfunction of lumbar region: Secondary | ICD-10-CM | POA: Diagnosis not present

## 2018-05-03 DIAGNOSIS — M9904 Segmental and somatic dysfunction of sacral region: Secondary | ICD-10-CM | POA: Diagnosis not present

## 2018-05-03 DIAGNOSIS — M9902 Segmental and somatic dysfunction of thoracic region: Secondary | ICD-10-CM | POA: Diagnosis not present

## 2018-05-03 DIAGNOSIS — M5135 Other intervertebral disc degeneration, thoracolumbar region: Secondary | ICD-10-CM | POA: Diagnosis not present

## 2018-05-03 DIAGNOSIS — M5136 Other intervertebral disc degeneration, lumbar region: Secondary | ICD-10-CM | POA: Diagnosis not present

## 2018-06-29 DIAGNOSIS — E7800 Pure hypercholesterolemia, unspecified: Secondary | ICD-10-CM | POA: Diagnosis not present

## 2018-06-29 DIAGNOSIS — Z125 Encounter for screening for malignant neoplasm of prostate: Secondary | ICD-10-CM | POA: Diagnosis not present

## 2018-06-29 DIAGNOSIS — Z7982 Long term (current) use of aspirin: Secondary | ICD-10-CM | POA: Diagnosis not present

## 2018-06-29 DIAGNOSIS — I10 Essential (primary) hypertension: Secondary | ICD-10-CM | POA: Diagnosis not present

## 2018-06-29 DIAGNOSIS — E039 Hypothyroidism, unspecified: Secondary | ICD-10-CM | POA: Diagnosis not present

## 2018-07-04 DIAGNOSIS — K219 Gastro-esophageal reflux disease without esophagitis: Secondary | ICD-10-CM | POA: Diagnosis not present

## 2018-07-04 DIAGNOSIS — I6529 Occlusion and stenosis of unspecified carotid artery: Secondary | ICD-10-CM | POA: Diagnosis not present

## 2018-07-04 DIAGNOSIS — N289 Disorder of kidney and ureter, unspecified: Secondary | ICD-10-CM | POA: Diagnosis not present

## 2018-07-04 DIAGNOSIS — B351 Tinea unguium: Secondary | ICD-10-CM | POA: Diagnosis not present

## 2018-07-04 DIAGNOSIS — I10 Essential (primary) hypertension: Secondary | ICD-10-CM | POA: Diagnosis not present

## 2018-07-04 DIAGNOSIS — Z6833 Body mass index (BMI) 33.0-33.9, adult: Secondary | ICD-10-CM | POA: Diagnosis not present

## 2018-07-04 DIAGNOSIS — M353 Polymyalgia rheumatica: Secondary | ICD-10-CM | POA: Diagnosis not present

## 2018-07-04 DIAGNOSIS — Z0000 Encounter for general adult medical examination without abnormal findings: Secondary | ICD-10-CM | POA: Diagnosis not present

## 2018-07-04 DIAGNOSIS — Z23 Encounter for immunization: Secondary | ICD-10-CM | POA: Diagnosis not present

## 2018-07-04 DIAGNOSIS — E039 Hypothyroidism, unspecified: Secondary | ICD-10-CM | POA: Diagnosis not present

## 2018-07-04 DIAGNOSIS — E7800 Pure hypercholesterolemia, unspecified: Secondary | ICD-10-CM | POA: Diagnosis not present

## 2018-07-04 DIAGNOSIS — E731 Secondary lactase deficiency: Secondary | ICD-10-CM | POA: Diagnosis not present

## 2018-07-26 DIAGNOSIS — M1990 Unspecified osteoarthritis, unspecified site: Secondary | ICD-10-CM | POA: Diagnosis not present

## 2018-07-26 DIAGNOSIS — M6530 Trigger finger, unspecified finger: Secondary | ICD-10-CM | POA: Diagnosis not present

## 2018-07-26 DIAGNOSIS — Z7952 Long term (current) use of systemic steroids: Secondary | ICD-10-CM | POA: Diagnosis not present

## 2018-07-26 DIAGNOSIS — M353 Polymyalgia rheumatica: Secondary | ICD-10-CM | POA: Diagnosis not present

## 2018-07-26 DIAGNOSIS — M79644 Pain in right finger(s): Secondary | ICD-10-CM | POA: Diagnosis not present

## 2018-07-26 DIAGNOSIS — M810 Age-related osteoporosis without current pathological fracture: Secondary | ICD-10-CM | POA: Diagnosis not present

## 2018-07-26 DIAGNOSIS — G5600 Carpal tunnel syndrome, unspecified upper limb: Secondary | ICD-10-CM | POA: Diagnosis not present

## 2018-07-26 DIAGNOSIS — M25519 Pain in unspecified shoulder: Secondary | ICD-10-CM | POA: Diagnosis not present
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
## 2018-10-22 IMAGING — RF DG ANKLE COMPLETE 3+V*R*
1 series · 3 of 3 positions shown · non-contrast
Comparison: None.

CLINICAL DATA: Right total ankle arthroplasty

EXAM:
DG C-ARM GT 120 MIN; RIGHT ANKLE - COMPLETE 3+ VIEW

[Series 1: run · 3 of 3 slices shown]
[im 1/3]
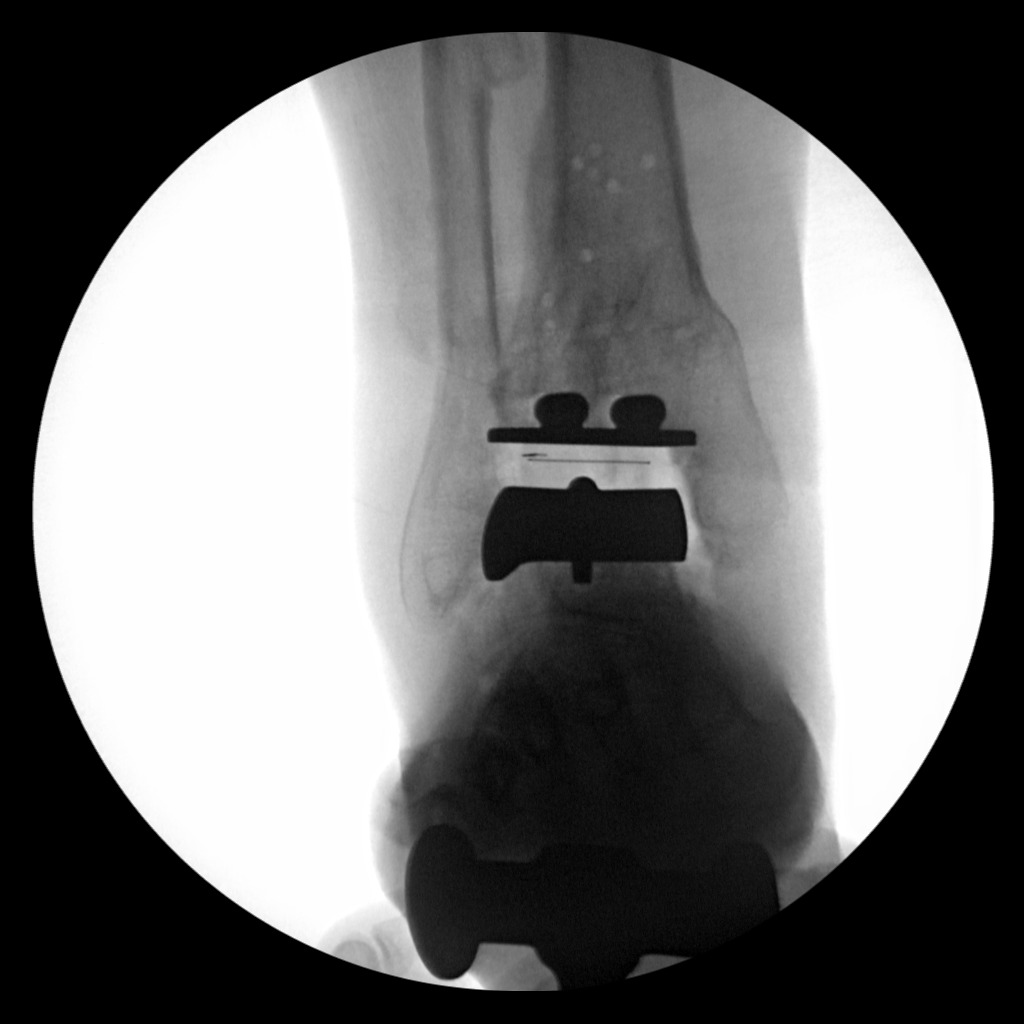
[im 2/3]
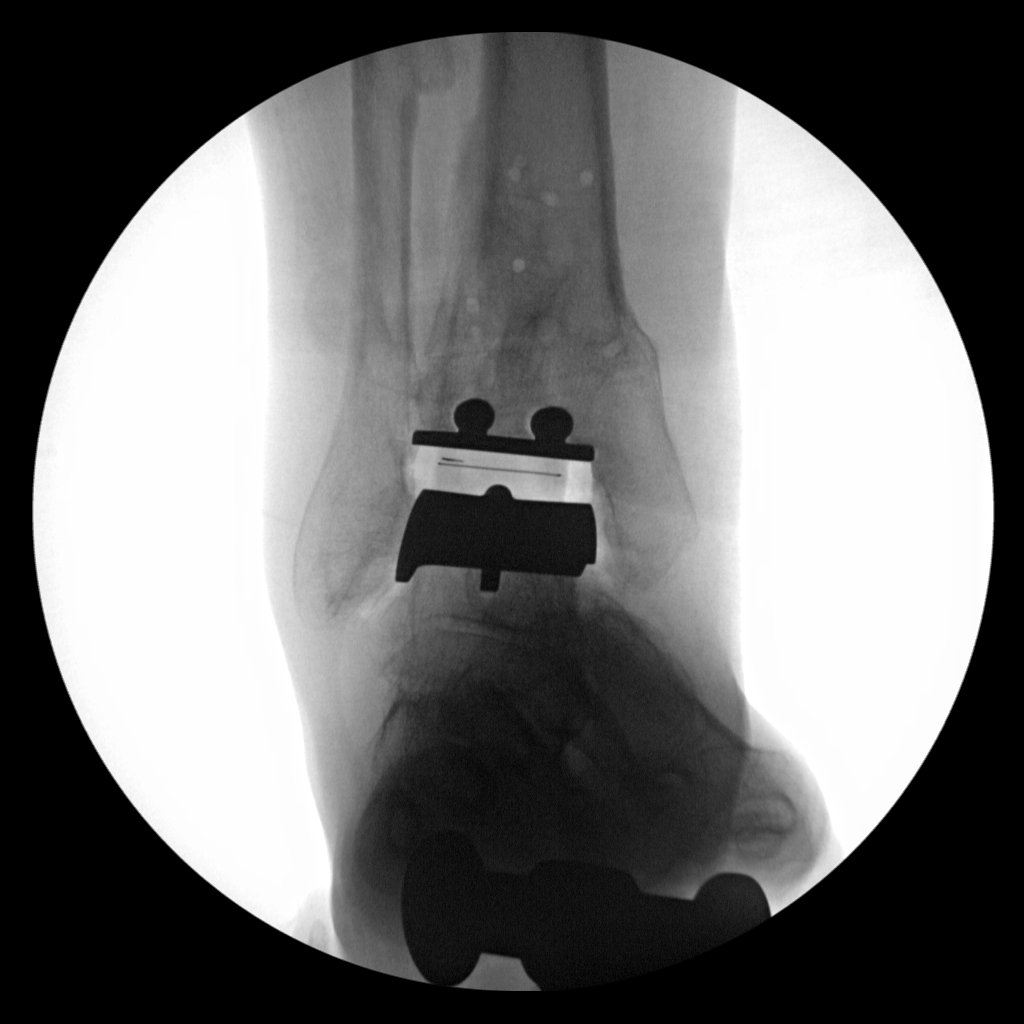
[im 3/3]
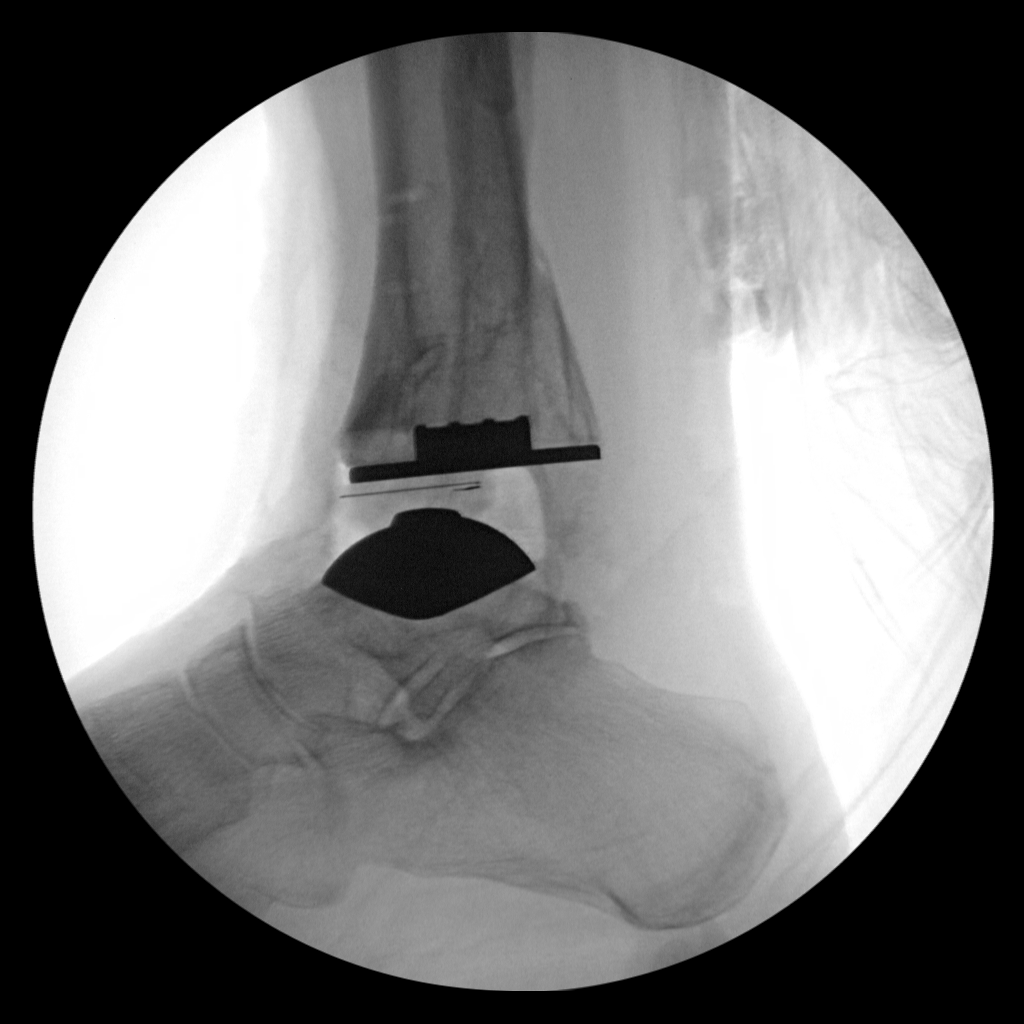

[3 of 3 positions shown; findings below may reference images not displayed]

FINDINGS: 3 intraoperative spot images demonstrate changes of right ankle
arthroplasty. No visible hardware complicating feature.
IMPRESSION: Right ankle arthroplasty.  No visible complicating feature.

## 2018-10-26 DIAGNOSIS — M810 Age-related osteoporosis without current pathological fracture: Secondary | ICD-10-CM | POA: Diagnosis not present

## 2019-02-08 DIAGNOSIS — G5600 Carpal tunnel syndrome, unspecified upper limb: Secondary | ICD-10-CM | POA: Diagnosis not present

## 2019-02-08 DIAGNOSIS — M810 Age-related osteoporosis without current pathological fracture: Secondary | ICD-10-CM | POA: Diagnosis not present

## 2019-02-08 DIAGNOSIS — M6530 Trigger finger, unspecified finger: Secondary | ICD-10-CM | POA: Diagnosis not present

## 2019-02-08 DIAGNOSIS — M353 Polymyalgia rheumatica: Secondary | ICD-10-CM | POA: Diagnosis not present

## 2019-02-08 DIAGNOSIS — M1990 Unspecified osteoarthritis, unspecified site: Secondary | ICD-10-CM | POA: Diagnosis not present

## 2019-02-27 DIAGNOSIS — M810 Age-related osteoporosis without current pathological fracture: Secondary | ICD-10-CM | POA: Diagnosis not present

## 2019-03-07 DIAGNOSIS — R197 Diarrhea, unspecified: Secondary | ICD-10-CM | POA: Diagnosis not present

## 2019-03-07 DIAGNOSIS — Z86010 Personal history of colonic polyps: Secondary | ICD-10-CM | POA: Diagnosis not present

## 2019-03-07 DIAGNOSIS — R142 Eructation: Secondary | ICD-10-CM | POA: Diagnosis not present

## 2019-03-07 DIAGNOSIS — K219 Gastro-esophageal reflux disease without esophagitis: Secondary | ICD-10-CM | POA: Diagnosis not present

## 2019-03-12 DIAGNOSIS — R197 Diarrhea, unspecified: Secondary | ICD-10-CM | POA: Diagnosis not present

## 2019-04-25 ENCOUNTER — Other Ambulatory Visit: Payer: Self-pay

## 2019-05-02 DIAGNOSIS — K219 Gastro-esophageal reflux disease without esophagitis: Secondary | ICD-10-CM | POA: Diagnosis not present

## 2019-05-02 DIAGNOSIS — R197 Diarrhea, unspecified: Secondary | ICD-10-CM | POA: Diagnosis not present

## 2019-05-10 DIAGNOSIS — H52223 Regular astigmatism, bilateral: Secondary | ICD-10-CM | POA: Diagnosis not present

## 2019-05-10 DIAGNOSIS — I10 Essential (primary) hypertension: Secondary | ICD-10-CM | POA: Diagnosis not present

## 2019-05-10 DIAGNOSIS — H524 Presbyopia: Secondary | ICD-10-CM | POA: Diagnosis not present

## 2019-05-10 DIAGNOSIS — H25099 Other age-related incipient cataract, unspecified eye: Secondary | ICD-10-CM | POA: Diagnosis not present

## 2019-05-10 DIAGNOSIS — H5203 Hypermetropia, bilateral: Secondary | ICD-10-CM | POA: Diagnosis not present

## 2019-05-14 DIAGNOSIS — M810 Age-related osteoporosis without current pathological fracture: Secondary | ICD-10-CM | POA: Diagnosis not present

## 2019-07-03 DIAGNOSIS — R197 Diarrhea, unspecified: Secondary | ICD-10-CM | POA: Diagnosis not present

## 2019-07-03 DIAGNOSIS — Z86010 Personal history of colonic polyps: Secondary | ICD-10-CM | POA: Diagnosis not present

## 2019-07-09 DIAGNOSIS — Z7982 Long term (current) use of aspirin: Secondary | ICD-10-CM | POA: Diagnosis not present

## 2019-07-09 DIAGNOSIS — Z125 Encounter for screening for malignant neoplasm of prostate: Secondary | ICD-10-CM | POA: Diagnosis not present

## 2019-07-09 DIAGNOSIS — E7800 Pure hypercholesterolemia, unspecified: Secondary | ICD-10-CM | POA: Diagnosis not present

## 2019-07-09 DIAGNOSIS — Z1159 Encounter for screening for other viral diseases: Secondary | ICD-10-CM | POA: Diagnosis not present

## 2019-07-09 DIAGNOSIS — I10 Essential (primary) hypertension: Secondary | ICD-10-CM | POA: Diagnosis not present

## 2019-07-09 DIAGNOSIS — E039 Hypothyroidism, unspecified: Secondary | ICD-10-CM | POA: Diagnosis not present

## 2019-07-09 DIAGNOSIS — M810 Age-related osteoporosis without current pathological fracture: Secondary | ICD-10-CM | POA: Diagnosis not present

## 2019-07-11 DIAGNOSIS — M810 Age-related osteoporosis without current pathological fracture: Secondary | ICD-10-CM | POA: Diagnosis not present

## 2019-07-11 DIAGNOSIS — Z7982 Long term (current) use of aspirin: Secondary | ICD-10-CM | POA: Diagnosis not present

## 2019-07-11 DIAGNOSIS — Z1159 Encounter for screening for other viral diseases: Secondary | ICD-10-CM | POA: Diagnosis not present

## 2019-07-11 DIAGNOSIS — I10 Essential (primary) hypertension: Secondary | ICD-10-CM | POA: Diagnosis not present

## 2019-07-11 DIAGNOSIS — E039 Hypothyroidism, unspecified: Secondary | ICD-10-CM | POA: Diagnosis not present

## 2019-07-11 DIAGNOSIS — Z125 Encounter for screening for malignant neoplasm of prostate: Secondary | ICD-10-CM | POA: Diagnosis not present

## 2019-07-12 DIAGNOSIS — Z0000 Encounter for general adult medical examination without abnormal findings: Secondary | ICD-10-CM | POA: Diagnosis not present

## 2019-07-12 DIAGNOSIS — M810 Age-related osteoporosis without current pathological fracture: Secondary | ICD-10-CM | POA: Diagnosis not present

## 2019-07-12 DIAGNOSIS — I10 Essential (primary) hypertension: Secondary | ICD-10-CM | POA: Diagnosis not present

## 2019-07-12 DIAGNOSIS — E7800 Pure hypercholesterolemia, unspecified: Secondary | ICD-10-CM | POA: Diagnosis not present

## 2019-07-12 DIAGNOSIS — Z7982 Long term (current) use of aspirin: Secondary | ICD-10-CM | POA: Diagnosis not present

## 2019-07-12 DIAGNOSIS — K219 Gastro-esophageal reflux disease without esophagitis: Secondary | ICD-10-CM | POA: Diagnosis not present

## 2019-07-12 DIAGNOSIS — E039 Hypothyroidism, unspecified: Secondary | ICD-10-CM | POA: Diagnosis not present

## 2019-07-12 DIAGNOSIS — Z23 Encounter for immunization: Secondary | ICD-10-CM | POA: Diagnosis not present

## 2019-07-12 DIAGNOSIS — I6529 Occlusion and stenosis of unspecified carotid artery: Secondary | ICD-10-CM | POA: Diagnosis not present

## 2019-08-10 DIAGNOSIS — M810 Age-related osteoporosis without current pathological fracture: Secondary | ICD-10-CM | POA: Diagnosis not present

## 2019-08-10 DIAGNOSIS — G5600 Carpal tunnel syndrome, unspecified upper limb: Secondary | ICD-10-CM | POA: Diagnosis not present

## 2019-08-10 DIAGNOSIS — M6530 Trigger finger, unspecified finger: Secondary | ICD-10-CM | POA: Diagnosis not present

## 2019-08-10 DIAGNOSIS — M1990 Unspecified osteoarthritis, unspecified site: Secondary | ICD-10-CM | POA: Diagnosis not present

## 2019-08-10 DIAGNOSIS — M353 Polymyalgia rheumatica: Secondary | ICD-10-CM | POA: Diagnosis not present

## 2019-08-13 DIAGNOSIS — R197 Diarrhea, unspecified: Secondary | ICD-10-CM | POA: Diagnosis not present

## 2019-08-13 DIAGNOSIS — K635 Polyp of colon: Secondary | ICD-10-CM | POA: Diagnosis not present

## 2019-08-13 DIAGNOSIS — Z86010 Personal history of colonic polyps: Secondary | ICD-10-CM | POA: Diagnosis not present

## 2019-08-13 DIAGNOSIS — Z1211 Encounter for screening for malignant neoplasm of colon: Secondary | ICD-10-CM | POA: Diagnosis not present

## 2019-08-13 DIAGNOSIS — D125 Benign neoplasm of sigmoid colon: Secondary | ICD-10-CM | POA: Diagnosis not present

## 2019-08-13 DIAGNOSIS — K644 Residual hemorrhoidal skin tags: Secondary | ICD-10-CM | POA: Diagnosis not present

## 2019-08-13 DIAGNOSIS — K648 Other hemorrhoids: Secondary | ICD-10-CM | POA: Diagnosis not present

## 2019-09-06 ENCOUNTER — Other Ambulatory Visit: Payer: Self-pay

## 2019-09-06 DIAGNOSIS — Z20828 Contact with and (suspected) exposure to other viral communicable diseases: Secondary | ICD-10-CM | POA: Diagnosis not present

## 2019-09-06 DIAGNOSIS — Z20822 Contact with and (suspected) exposure to covid-19: Secondary | ICD-10-CM

## 2019-09-08 LAB — NOVEL CORONAVIRUS, NAA: SARS-CoV-2, NAA: NOT DETECTED

## 2019-10-23 ENCOUNTER — Ambulatory Visit: Payer: Medicare Other

## 2019-11-01 ENCOUNTER — Ambulatory Visit: Payer: Medicare Other | Attending: Internal Medicine

## 2019-11-01 DIAGNOSIS — Z23 Encounter for immunization: Secondary | ICD-10-CM

## 2019-11-09 ENCOUNTER — Ambulatory Visit: Payer: Medicare Other

## 2019-11-19 DIAGNOSIS — M810 Age-related osteoporosis without current pathological fracture: Secondary | ICD-10-CM | POA: Diagnosis not present

## 2019-11-26 ENCOUNTER — Ambulatory Visit: Payer: Medicare Other | Attending: Internal Medicine

## 2019-11-26 DIAGNOSIS — Z23 Encounter for immunization: Secondary | ICD-10-CM

## 2019-11-26 NOTE — Progress Notes (Signed)
   Covid-19 Vaccination Clinic  Name:  Francis Brewer    MRN: [id number] DOB: 04-16-1946  11/26/2019  Francis Brewer was observed post Covid-19 immunization for 15 minutes without incidence. He was provided with Vaccine Information Sheet and instruction to access the V-Safe system.   Francis Brewer was instructed to call 911 with any severe reactions post vaccine: Marland Kitchen Difficulty breathing  . Swelling of your face and throat  . A fast heartbeat  . A bad rash all over your body  . Dizziness and weakness    Immunizations Administered    Name Date Dose VIS Date Route   Pfizer COVID-19 Vaccine 11/26/2019  3:47 PM 0.3 mL 09/07/2019 Intramuscular   Manufacturer: [place]   Lot: [id number]   [place]: [id number]

## 2019-12-04 DIAGNOSIS — L309 Dermatitis, unspecified: Secondary | ICD-10-CM | POA: Diagnosis not present

## 2019-12-21 DIAGNOSIS — K644 Residual hemorrhoidal skin tags: Secondary | ICD-10-CM | POA: Diagnosis not present

## 2019-12-21 DIAGNOSIS — K648 Other hemorrhoids: Secondary | ICD-10-CM | POA: Insufficient documentation

## 2019-12-21 HISTORY — DX: Other hemorrhoids: K64.8

## 2019-12-26 DIAGNOSIS — M25572 Pain in left ankle and joints of left foot: Secondary | ICD-10-CM | POA: Diagnosis not present

## 2019-12-26 DIAGNOSIS — M7752 Other enthesopathy of left foot: Secondary | ICD-10-CM | POA: Diagnosis not present

## 2019-12-26 DIAGNOSIS — M21612 Bunion of left foot: Secondary | ICD-10-CM | POA: Diagnosis not present

## 2020-01-02 DIAGNOSIS — M5136 Other intervertebral disc degeneration, lumbar region: Secondary | ICD-10-CM | POA: Diagnosis not present

## 2020-01-02 DIAGNOSIS — M5137 Other intervertebral disc degeneration, lumbosacral region: Secondary | ICD-10-CM | POA: Diagnosis not present

## 2020-01-02 DIAGNOSIS — M9903 Segmental and somatic dysfunction of lumbar region: Secondary | ICD-10-CM | POA: Diagnosis not present

## 2020-01-02 DIAGNOSIS — M9902 Segmental and somatic dysfunction of thoracic region: Secondary | ICD-10-CM | POA: Diagnosis not present

## 2020-01-02 DIAGNOSIS — M5135 Other intervertebral disc degeneration, thoracolumbar region: Secondary | ICD-10-CM | POA: Diagnosis not present

## 2020-01-02 DIAGNOSIS — M9904 Segmental and somatic dysfunction of sacral region: Secondary | ICD-10-CM | POA: Diagnosis not present

## 2020-01-09 DIAGNOSIS — K648 Other hemorrhoids: Secondary | ICD-10-CM | POA: Diagnosis not present

## 2020-01-10 DIAGNOSIS — M25572 Pain in left ankle and joints of left foot: Secondary | ICD-10-CM | POA: Diagnosis not present

## 2020-01-23 DIAGNOSIS — K648 Other hemorrhoids: Secondary | ICD-10-CM | POA: Diagnosis not present

## 2020-01-29 DIAGNOSIS — M9903 Segmental and somatic dysfunction of lumbar region: Secondary | ICD-10-CM | POA: Diagnosis not present

## 2020-01-29 DIAGNOSIS — M5135 Other intervertebral disc degeneration, thoracolumbar region: Secondary | ICD-10-CM | POA: Diagnosis not present

## 2020-01-29 DIAGNOSIS — M9902 Segmental and somatic dysfunction of thoracic region: Secondary | ICD-10-CM | POA: Diagnosis not present

## 2020-01-29 DIAGNOSIS — M5136 Other intervertebral disc degeneration, lumbar region: Secondary | ICD-10-CM | POA: Diagnosis not present

## 2020-01-29 DIAGNOSIS — M9904 Segmental and somatic dysfunction of sacral region: Secondary | ICD-10-CM | POA: Diagnosis not present

## 2020-01-29 DIAGNOSIS — M5137 Other intervertebral disc degeneration, lumbosacral region: Secondary | ICD-10-CM | POA: Diagnosis not present

## 2020-02-07 DIAGNOSIS — G5600 Carpal tunnel syndrome, unspecified upper limb: Secondary | ICD-10-CM | POA: Diagnosis not present

## 2020-02-07 DIAGNOSIS — R252 Cramp and spasm: Secondary | ICD-10-CM | POA: Diagnosis not present

## 2020-02-07 DIAGNOSIS — M353 Polymyalgia rheumatica: Secondary | ICD-10-CM | POA: Diagnosis not present

## 2020-02-07 DIAGNOSIS — M1990 Unspecified osteoarthritis, unspecified site: Secondary | ICD-10-CM | POA: Diagnosis not present

## 2020-02-07 DIAGNOSIS — M810 Age-related osteoporosis without current pathological fracture: Secondary | ICD-10-CM | POA: Diagnosis not present

## 2020-02-07 DIAGNOSIS — Z8739 Personal history of other diseases of the musculoskeletal system and connective tissue: Secondary | ICD-10-CM | POA: Diagnosis not present

## 2020-02-07 DIAGNOSIS — M6530 Trigger finger, unspecified finger: Secondary | ICD-10-CM | POA: Diagnosis not present

## 2020-03-04 DIAGNOSIS — M5136 Other intervertebral disc degeneration, lumbar region: Secondary | ICD-10-CM | POA: Diagnosis not present

## 2020-03-04 DIAGNOSIS — M9902 Segmental and somatic dysfunction of thoracic region: Secondary | ICD-10-CM | POA: Diagnosis not present

## 2020-03-04 DIAGNOSIS — M9903 Segmental and somatic dysfunction of lumbar region: Secondary | ICD-10-CM | POA: Diagnosis not present

## 2020-03-04 DIAGNOSIS — M9904 Segmental and somatic dysfunction of sacral region: Secondary | ICD-10-CM | POA: Diagnosis not present

## 2020-03-04 DIAGNOSIS — M5137 Other intervertebral disc degeneration, lumbosacral region: Secondary | ICD-10-CM | POA: Diagnosis not present

## 2020-03-04 DIAGNOSIS — M5135 Other intervertebral disc degeneration, thoracolumbar region: Secondary | ICD-10-CM | POA: Diagnosis not present

## 2020-04-01 DIAGNOSIS — M5136 Other intervertebral disc degeneration, lumbar region: Secondary | ICD-10-CM | POA: Diagnosis not present

## 2020-04-01 DIAGNOSIS — M5135 Other intervertebral disc degeneration, thoracolumbar region: Secondary | ICD-10-CM | POA: Diagnosis not present

## 2020-04-01 DIAGNOSIS — M9903 Segmental and somatic dysfunction of lumbar region: Secondary | ICD-10-CM | POA: Diagnosis not present

## 2020-04-01 DIAGNOSIS — M9904 Segmental and somatic dysfunction of sacral region: Secondary | ICD-10-CM | POA: Diagnosis not present

## 2020-04-01 DIAGNOSIS — M9902 Segmental and somatic dysfunction of thoracic region: Secondary | ICD-10-CM | POA: Diagnosis not present

## 2020-04-01 DIAGNOSIS — M5137 Other intervertebral disc degeneration, lumbosacral region: Secondary | ICD-10-CM | POA: Diagnosis not present

## 2020-05-15 DIAGNOSIS — M5135 Other intervertebral disc degeneration, thoracolumbar region: Secondary | ICD-10-CM | POA: Diagnosis not present

## 2020-05-15 DIAGNOSIS — M9902 Segmental and somatic dysfunction of thoracic region: Secondary | ICD-10-CM | POA: Diagnosis not present

## 2020-05-15 DIAGNOSIS — M9903 Segmental and somatic dysfunction of lumbar region: Secondary | ICD-10-CM | POA: Diagnosis not present

## 2020-05-15 DIAGNOSIS — M5137 Other intervertebral disc degeneration, lumbosacral region: Secondary | ICD-10-CM | POA: Diagnosis not present

## 2020-05-15 DIAGNOSIS — M5136 Other intervertebral disc degeneration, lumbar region: Secondary | ICD-10-CM | POA: Diagnosis not present

## 2020-05-15 DIAGNOSIS — M9904 Segmental and somatic dysfunction of sacral region: Secondary | ICD-10-CM | POA: Diagnosis not present

## 2020-05-22 DIAGNOSIS — M810 Age-related osteoporosis without current pathological fracture: Secondary | ICD-10-CM | POA: Diagnosis not present

## 2020-05-28 DIAGNOSIS — H52223 Regular astigmatism, bilateral: Secondary | ICD-10-CM | POA: Diagnosis not present

## 2020-05-28 DIAGNOSIS — I10 Essential (primary) hypertension: Secondary | ICD-10-CM | POA: Diagnosis not present

## 2020-05-28 DIAGNOSIS — H25099 Other age-related incipient cataract, unspecified eye: Secondary | ICD-10-CM | POA: Diagnosis not present

## 2020-05-28 DIAGNOSIS — H5203 Hypermetropia, bilateral: Secondary | ICD-10-CM | POA: Diagnosis not present

## 2020-05-28 DIAGNOSIS — H524 Presbyopia: Secondary | ICD-10-CM | POA: Diagnosis not present

## 2020-06-23 DIAGNOSIS — M810 Age-related osteoporosis without current pathological fracture: Secondary | ICD-10-CM | POA: Diagnosis not present

## 2020-06-23 DIAGNOSIS — M25562 Pain in left knee: Secondary | ICD-10-CM | POA: Diagnosis not present

## 2020-06-23 DIAGNOSIS — G5600 Carpal tunnel syndrome, unspecified upper limb: Secondary | ICD-10-CM | POA: Diagnosis not present

## 2020-06-23 DIAGNOSIS — M6530 Trigger finger, unspecified finger: Secondary | ICD-10-CM | POA: Diagnosis not present

## 2020-06-23 DIAGNOSIS — M25461 Effusion, right knee: Secondary | ICD-10-CM | POA: Diagnosis not present

## 2020-06-23 DIAGNOSIS — M25462 Effusion, left knee: Secondary | ICD-10-CM | POA: Diagnosis not present

## 2020-06-23 DIAGNOSIS — M1712 Unilateral primary osteoarthritis, left knee: Secondary | ICD-10-CM | POA: Diagnosis not present

## 2020-06-23 DIAGNOSIS — M109 Gout, unspecified: Secondary | ICD-10-CM | POA: Diagnosis not present

## 2020-06-23 DIAGNOSIS — Z8739 Personal history of other diseases of the musculoskeletal system and connective tissue: Secondary | ICD-10-CM | POA: Diagnosis not present

## 2020-06-23 DIAGNOSIS — M1711 Unilateral primary osteoarthritis, right knee: Secondary | ICD-10-CM | POA: Diagnosis not present

## 2020-06-23 DIAGNOSIS — M1990 Unspecified osteoarthritis, unspecified site: Secondary | ICD-10-CM | POA: Diagnosis not present

## 2020-06-23 DIAGNOSIS — R252 Cramp and spasm: Secondary | ICD-10-CM | POA: Diagnosis not present

## 2020-06-23 DIAGNOSIS — M25561 Pain in right knee: Secondary | ICD-10-CM | POA: Diagnosis not present

## 2020-07-03 DIAGNOSIS — R252 Cramp and spasm: Secondary | ICD-10-CM | POA: Diagnosis not present

## 2020-07-03 DIAGNOSIS — G5600 Carpal tunnel syndrome, unspecified upper limb: Secondary | ICD-10-CM | POA: Diagnosis not present

## 2020-07-03 DIAGNOSIS — M25562 Pain in left knee: Secondary | ICD-10-CM | POA: Diagnosis not present

## 2020-07-03 DIAGNOSIS — M1990 Unspecified osteoarthritis, unspecified site: Secondary | ICD-10-CM | POA: Diagnosis not present

## 2020-07-03 DIAGNOSIS — Z8739 Personal history of other diseases of the musculoskeletal system and connective tissue: Secondary | ICD-10-CM | POA: Diagnosis not present

## 2020-07-03 DIAGNOSIS — M810 Age-related osteoporosis without current pathological fracture: Secondary | ICD-10-CM | POA: Diagnosis not present

## 2020-07-03 DIAGNOSIS — M25462 Effusion, left knee: Secondary | ICD-10-CM | POA: Diagnosis not present

## 2020-07-03 DIAGNOSIS — M6530 Trigger finger, unspecified finger: Secondary | ICD-10-CM | POA: Diagnosis not present

## 2020-07-03 DIAGNOSIS — N289 Disorder of kidney and ureter, unspecified: Secondary | ICD-10-CM | POA: Diagnosis not present

## 2020-07-03 DIAGNOSIS — M109 Gout, unspecified: Secondary | ICD-10-CM | POA: Diagnosis not present

## 2020-07-14 DIAGNOSIS — E7800 Pure hypercholesterolemia, unspecified: Secondary | ICD-10-CM | POA: Diagnosis not present

## 2020-07-14 DIAGNOSIS — E039 Hypothyroidism, unspecified: Secondary | ICD-10-CM | POA: Diagnosis not present

## 2020-07-14 DIAGNOSIS — Z125 Encounter for screening for malignant neoplasm of prostate: Secondary | ICD-10-CM | POA: Diagnosis not present

## 2020-07-14 DIAGNOSIS — I10 Essential (primary) hypertension: Secondary | ICD-10-CM | POA: Diagnosis not present

## 2020-07-17 DIAGNOSIS — M109 Gout, unspecified: Secondary | ICD-10-CM | POA: Diagnosis not present

## 2020-07-17 DIAGNOSIS — R9431 Abnormal electrocardiogram [ECG] [EKG]: Secondary | ICD-10-CM | POA: Diagnosis not present

## 2020-07-17 DIAGNOSIS — Z0000 Encounter for general adult medical examination without abnormal findings: Secondary | ICD-10-CM | POA: Diagnosis not present

## 2020-07-17 DIAGNOSIS — E039 Hypothyroidism, unspecified: Secondary | ICD-10-CM | POA: Diagnosis not present

## 2020-07-17 DIAGNOSIS — R350 Frequency of micturition: Secondary | ICD-10-CM | POA: Diagnosis not present

## 2020-07-17 DIAGNOSIS — E7800 Pure hypercholesterolemia, unspecified: Secondary | ICD-10-CM | POA: Diagnosis not present

## 2020-07-17 DIAGNOSIS — Z23 Encounter for immunization: Secondary | ICD-10-CM | POA: Diagnosis not present

## 2020-07-17 DIAGNOSIS — M353 Polymyalgia rheumatica: Secondary | ICD-10-CM | POA: Diagnosis not present

## 2020-07-17 DIAGNOSIS — K219 Gastro-esophageal reflux disease without esophagitis: Secondary | ICD-10-CM | POA: Diagnosis not present

## 2020-07-17 DIAGNOSIS — M810 Age-related osteoporosis without current pathological fracture: Secondary | ICD-10-CM | POA: Diagnosis not present

## 2020-07-17 DIAGNOSIS — I6529 Occlusion and stenosis of unspecified carotid artery: Secondary | ICD-10-CM | POA: Diagnosis not present

## 2020-07-17 DIAGNOSIS — I10 Essential (primary) hypertension: Secondary | ICD-10-CM | POA: Diagnosis not present

## 2020-07-31 DIAGNOSIS — M25462 Effusion, left knee: Secondary | ICD-10-CM | POA: Diagnosis not present

## 2020-07-31 DIAGNOSIS — M25562 Pain in left knee: Secondary | ICD-10-CM | POA: Diagnosis not present

## 2020-07-31 DIAGNOSIS — Z8739 Personal history of other diseases of the musculoskeletal system and connective tissue: Secondary | ICD-10-CM | POA: Diagnosis not present

## 2020-07-31 DIAGNOSIS — M109 Gout, unspecified: Secondary | ICD-10-CM | POA: Diagnosis not present

## 2020-07-31 DIAGNOSIS — N289 Disorder of kidney and ureter, unspecified: Secondary | ICD-10-CM | POA: Diagnosis not present

## 2020-07-31 DIAGNOSIS — R252 Cramp and spasm: Secondary | ICD-10-CM | POA: Diagnosis not present

## 2020-07-31 DIAGNOSIS — M810 Age-related osteoporosis without current pathological fracture: Secondary | ICD-10-CM | POA: Diagnosis not present

## 2020-07-31 DIAGNOSIS — G5600 Carpal tunnel syndrome, unspecified upper limb: Secondary | ICD-10-CM | POA: Diagnosis not present

## 2020-07-31 DIAGNOSIS — M1990 Unspecified osteoarthritis, unspecified site: Secondary | ICD-10-CM | POA: Diagnosis not present

## 2020-07-31 DIAGNOSIS — M6530 Trigger finger, unspecified finger: Secondary | ICD-10-CM | POA: Diagnosis not present

## 2020-08-04 DIAGNOSIS — R1031 Right lower quadrant pain: Secondary | ICD-10-CM | POA: Diagnosis not present

## 2020-08-04 DIAGNOSIS — L03314 Cellulitis of groin: Secondary | ICD-10-CM | POA: Diagnosis not present

## 2020-08-04 DIAGNOSIS — L02214 Cutaneous abscess of groin: Secondary | ICD-10-CM | POA: Diagnosis not present

## 2020-08-11 DIAGNOSIS — R1031 Right lower quadrant pain: Secondary | ICD-10-CM | POA: Diagnosis not present

## 2020-08-11 DIAGNOSIS — L03314 Cellulitis of groin: Secondary | ICD-10-CM | POA: Diagnosis not present

## 2020-08-11 DIAGNOSIS — L02214 Cutaneous abscess of groin: Secondary | ICD-10-CM | POA: Diagnosis not present

## 2020-09-30 DIAGNOSIS — R252 Cramp and spasm: Secondary | ICD-10-CM | POA: Diagnosis not present

## 2020-09-30 DIAGNOSIS — Z8739 Personal history of other diseases of the musculoskeletal system and connective tissue: Secondary | ICD-10-CM | POA: Diagnosis not present

## 2020-09-30 DIAGNOSIS — G5600 Carpal tunnel syndrome, unspecified upper limb: Secondary | ICD-10-CM | POA: Diagnosis not present

## 2020-09-30 DIAGNOSIS — M6530 Trigger finger, unspecified finger: Secondary | ICD-10-CM | POA: Diagnosis not present

## 2020-09-30 DIAGNOSIS — M810 Age-related osteoporosis without current pathological fracture: Secondary | ICD-10-CM | POA: Diagnosis not present

## 2020-09-30 DIAGNOSIS — M109 Gout, unspecified: Secondary | ICD-10-CM | POA: Diagnosis not present

## 2020-09-30 DIAGNOSIS — N289 Disorder of kidney and ureter, unspecified: Secondary | ICD-10-CM | POA: Diagnosis not present

## 2020-09-30 DIAGNOSIS — M1990 Unspecified osteoarthritis, unspecified site: Secondary | ICD-10-CM | POA: Diagnosis not present

## 2020-11-25 DIAGNOSIS — M810 Age-related osteoporosis without current pathological fracture: Secondary | ICD-10-CM | POA: Diagnosis not present

## 2021-01-12 DIAGNOSIS — M25562 Pain in left knee: Secondary | ICD-10-CM | POA: Diagnosis not present

## 2021-01-12 DIAGNOSIS — M1A0620 Idiopathic chronic gout, left knee, without tophus (tophi): Secondary | ICD-10-CM | POA: Diagnosis not present

## 2021-01-22 DIAGNOSIS — M6530 Trigger finger, unspecified finger: Secondary | ICD-10-CM | POA: Diagnosis not present

## 2021-01-22 DIAGNOSIS — G5600 Carpal tunnel syndrome, unspecified upper limb: Secondary | ICD-10-CM | POA: Diagnosis not present

## 2021-01-22 DIAGNOSIS — M109 Gout, unspecified: Secondary | ICD-10-CM | POA: Diagnosis not present

## 2021-01-22 DIAGNOSIS — R748 Abnormal levels of other serum enzymes: Secondary | ICD-10-CM | POA: Diagnosis not present

## 2021-01-22 DIAGNOSIS — M810 Age-related osteoporosis without current pathological fracture: Secondary | ICD-10-CM | POA: Diagnosis not present

## 2021-01-22 DIAGNOSIS — M1990 Unspecified osteoarthritis, unspecified site: Secondary | ICD-10-CM | POA: Diagnosis not present

## 2021-01-22 DIAGNOSIS — I10 Essential (primary) hypertension: Secondary | ICD-10-CM | POA: Diagnosis not present

## 2021-01-22 DIAGNOSIS — R252 Cramp and spasm: Secondary | ICD-10-CM | POA: Diagnosis not present

## 2021-01-22 DIAGNOSIS — Z8739 Personal history of other diseases of the musculoskeletal system and connective tissue: Secondary | ICD-10-CM | POA: Diagnosis not present

## 2021-04-23 DIAGNOSIS — R252 Cramp and spasm: Secondary | ICD-10-CM | POA: Diagnosis not present

## 2021-04-23 DIAGNOSIS — Z8739 Personal history of other diseases of the musculoskeletal system and connective tissue: Secondary | ICD-10-CM | POA: Diagnosis not present

## 2021-04-23 DIAGNOSIS — G5600 Carpal tunnel syndrome, unspecified upper limb: Secondary | ICD-10-CM | POA: Diagnosis not present

## 2021-04-23 DIAGNOSIS — M1990 Unspecified osteoarthritis, unspecified site: Secondary | ICD-10-CM | POA: Diagnosis not present

## 2021-04-23 DIAGNOSIS — M109 Gout, unspecified: Secondary | ICD-10-CM | POA: Diagnosis not present

## 2021-04-23 DIAGNOSIS — R748 Abnormal levels of other serum enzymes: Secondary | ICD-10-CM | POA: Diagnosis not present

## 2021-04-23 DIAGNOSIS — M6530 Trigger finger, unspecified finger: Secondary | ICD-10-CM | POA: Diagnosis not present

## 2021-04-23 DIAGNOSIS — M810 Age-related osteoporosis without current pathological fracture: Secondary | ICD-10-CM | POA: Diagnosis not present

## 2021-05-11 DIAGNOSIS — M9902 Segmental and somatic dysfunction of thoracic region: Secondary | ICD-10-CM | POA: Diagnosis not present

## 2021-05-11 DIAGNOSIS — M9904 Segmental and somatic dysfunction of sacral region: Secondary | ICD-10-CM | POA: Diagnosis not present

## 2021-05-11 DIAGNOSIS — M5137 Other intervertebral disc degeneration, lumbosacral region: Secondary | ICD-10-CM | POA: Diagnosis not present

## 2021-05-11 DIAGNOSIS — M5135 Other intervertebral disc degeneration, thoracolumbar region: Secondary | ICD-10-CM | POA: Diagnosis not present

## 2021-05-11 DIAGNOSIS — M9903 Segmental and somatic dysfunction of lumbar region: Secondary | ICD-10-CM | POA: Diagnosis not present

## 2021-05-11 DIAGNOSIS — M5136 Other intervertebral disc degeneration, lumbar region: Secondary | ICD-10-CM | POA: Diagnosis not present

## 2021-05-21 DIAGNOSIS — M109 Gout, unspecified: Secondary | ICD-10-CM | POA: Diagnosis not present

## 2021-05-21 DIAGNOSIS — Z79899 Other long term (current) drug therapy: Secondary | ICD-10-CM | POA: Diagnosis not present

## 2021-06-02 DIAGNOSIS — M810 Age-related osteoporosis without current pathological fracture: Secondary | ICD-10-CM | POA: Diagnosis not present

## 2021-06-22 DIAGNOSIS — M9904 Segmental and somatic dysfunction of sacral region: Secondary | ICD-10-CM | POA: Diagnosis not present

## 2021-06-22 DIAGNOSIS — M5135 Other intervertebral disc degeneration, thoracolumbar region: Secondary | ICD-10-CM | POA: Diagnosis not present

## 2021-06-22 DIAGNOSIS — M9903 Segmental and somatic dysfunction of lumbar region: Secondary | ICD-10-CM | POA: Diagnosis not present

## 2021-06-22 DIAGNOSIS — M5137 Other intervertebral disc degeneration, lumbosacral region: Secondary | ICD-10-CM | POA: Diagnosis not present

## 2021-06-22 DIAGNOSIS — M9902 Segmental and somatic dysfunction of thoracic region: Secondary | ICD-10-CM | POA: Diagnosis not present

## 2021-06-22 DIAGNOSIS — M5136 Other intervertebral disc degeneration, lumbar region: Secondary | ICD-10-CM | POA: Diagnosis not present

## 2021-07-15 DIAGNOSIS — E039 Hypothyroidism, unspecified: Secondary | ICD-10-CM | POA: Diagnosis not present

## 2021-07-15 DIAGNOSIS — M810 Age-related osteoporosis without current pathological fracture: Secondary | ICD-10-CM | POA: Diagnosis not present

## 2021-07-15 DIAGNOSIS — Z125 Encounter for screening for malignant neoplasm of prostate: Secondary | ICD-10-CM | POA: Diagnosis not present

## 2021-07-15 DIAGNOSIS — I10 Essential (primary) hypertension: Secondary | ICD-10-CM | POA: Diagnosis not present

## 2021-07-15 DIAGNOSIS — E7801 Familial hypercholesterolemia: Secondary | ICD-10-CM | POA: Diagnosis not present

## 2021-07-22 ENCOUNTER — Other Ambulatory Visit: Payer: Self-pay | Admitting: Internal Medicine

## 2021-07-22 DIAGNOSIS — M810 Age-related osteoporosis without current pathological fracture: Secondary | ICD-10-CM | POA: Diagnosis not present

## 2021-07-22 DIAGNOSIS — I10 Essential (primary) hypertension: Secondary | ICD-10-CM | POA: Diagnosis not present

## 2021-07-22 DIAGNOSIS — R9431 Abnormal electrocardiogram [ECG] [EKG]: Secondary | ICD-10-CM

## 2021-07-22 DIAGNOSIS — M109 Gout, unspecified: Secondary | ICD-10-CM | POA: Diagnosis not present

## 2021-07-22 DIAGNOSIS — K219 Gastro-esophageal reflux disease without esophagitis: Secondary | ICD-10-CM | POA: Diagnosis not present

## 2021-07-22 DIAGNOSIS — R350 Frequency of micturition: Secondary | ICD-10-CM | POA: Diagnosis not present

## 2021-07-22 DIAGNOSIS — Z0000 Encounter for general adult medical examination without abnormal findings: Secondary | ICD-10-CM | POA: Diagnosis not present

## 2021-07-22 DIAGNOSIS — R011 Cardiac murmur, unspecified: Secondary | ICD-10-CM | POA: Diagnosis not present

## 2021-07-22 DIAGNOSIS — E039 Hypothyroidism, unspecified: Secondary | ICD-10-CM | POA: Diagnosis not present

## 2021-07-22 DIAGNOSIS — I6529 Occlusion and stenosis of unspecified carotid artery: Secondary | ICD-10-CM | POA: Diagnosis not present

## 2021-07-22 DIAGNOSIS — K529 Noninfective gastroenteritis and colitis, unspecified: Secondary | ICD-10-CM | POA: Diagnosis not present

## 2021-07-22 DIAGNOSIS — Z23 Encounter for immunization: Secondary | ICD-10-CM | POA: Diagnosis not present

## 2021-07-23 DIAGNOSIS — H2513 Age-related nuclear cataract, bilateral: Secondary | ICD-10-CM | POA: Diagnosis not present

## 2021-07-23 DIAGNOSIS — H52223 Regular astigmatism, bilateral: Secondary | ICD-10-CM | POA: Diagnosis not present

## 2021-07-23 DIAGNOSIS — H524 Presbyopia: Secondary | ICD-10-CM | POA: Diagnosis not present

## 2021-07-27 DIAGNOSIS — Z8739 Personal history of other diseases of the musculoskeletal system and connective tissue: Secondary | ICD-10-CM | POA: Diagnosis not present

## 2021-07-27 DIAGNOSIS — M810 Age-related osteoporosis without current pathological fracture: Secondary | ICD-10-CM | POA: Diagnosis not present

## 2021-07-27 DIAGNOSIS — M1990 Unspecified osteoarthritis, unspecified site: Secondary | ICD-10-CM | POA: Diagnosis not present

## 2021-07-27 DIAGNOSIS — G5600 Carpal tunnel syndrome, unspecified upper limb: Secondary | ICD-10-CM | POA: Diagnosis not present

## 2021-07-27 DIAGNOSIS — M6530 Trigger finger, unspecified finger: Secondary | ICD-10-CM | POA: Diagnosis not present

## 2021-07-27 DIAGNOSIS — R748 Abnormal levels of other serum enzymes: Secondary | ICD-10-CM | POA: Diagnosis not present

## 2021-07-27 DIAGNOSIS — M109 Gout, unspecified: Secondary | ICD-10-CM | POA: Diagnosis not present

## 2021-07-30 DIAGNOSIS — R011 Cardiac murmur, unspecified: Secondary | ICD-10-CM | POA: Diagnosis not present

## 2021-08-10 ENCOUNTER — Ambulatory Visit
Admission: RE | Admit: 2021-08-10 | Discharge: 2021-08-10 | Disposition: A | Discharge status: Home / Self Care | Payer: Self-pay | Source: Ambulatory Visit | Attending: Internal Medicine | Admitting: Internal Medicine

## 2021-08-10 DIAGNOSIS — R9431 Abnormal electrocardiogram [ECG] [EKG]: Secondary | ICD-10-CM

## 2021-08-31 DIAGNOSIS — I2584 Coronary atherosclerosis due to calcified coronary lesion: Secondary | ICD-10-CM | POA: Diagnosis not present

## 2021-08-31 DIAGNOSIS — L309 Dermatitis, unspecified: Secondary | ICD-10-CM | POA: Diagnosis not present

## 2021-08-31 DIAGNOSIS — I2510 Atherosclerotic heart disease of native coronary artery without angina pectoris: Secondary | ICD-10-CM | POA: Diagnosis not present

## 2021-09-07 ENCOUNTER — Encounter: Payer: Self-pay | Admitting: Cardiology

## 2021-09-07 ENCOUNTER — Ambulatory Visit: Payer: Medicare Other | Admitting: Cardiology

## 2021-09-07 ENCOUNTER — Other Ambulatory Visit: Payer: Self-pay

## 2021-09-07 VITALS — BP 136/76 | HR 61 | Temp 98.0°F | Resp 16 | Ht 67.0 in | Wt 207.0 lb

## 2021-09-07 DIAGNOSIS — I10 Essential (primary) hypertension: Secondary | ICD-10-CM | POA: Insufficient documentation

## 2021-09-07 DIAGNOSIS — R0609 Other forms of dyspnea: Secondary | ICD-10-CM | POA: Insufficient documentation

## 2021-09-07 DIAGNOSIS — R931 Abnormal findings on diagnostic imaging of heart and coronary circulation: Secondary | ICD-10-CM

## 2021-09-07 DIAGNOSIS — E782 Mixed hyperlipidemia: Secondary | ICD-10-CM | POA: Insufficient documentation

## 2021-09-07 MED ORDER — ATORVASTATIN CALCIUM 80 MG PO TABS: 80.0000 mg | ORAL_TABLET | Freq: Every day | ORAL | 3 refills | Status: DC

## 2021-09-07 NOTE — Progress Notes (Addendum)
  Patient referred by Pharr, Walter, MD for elevated coronary calcium score  Subjective:   Francis Brewer, male    DOB: 03/27/1946, 75 y.o.   MRN: [id number]   Chief Complaint  Patient presents with   Hypertension   Hyperlipidemia   New Patient (Initial Visit)     HPI  75 y.o. Caucasian male with hypertension, hyperlipidemia, elevated coronary calcium score.  Patient is retired, lives alone.  His wife passed 4 years ago.  He lives fairly sedentary lifestyle, with physical activity including limited walking.  Physical activity is limited due to prior injuries and surgery in right ankle.  With amount of physical activity, he does not have any complaints of chest pain.  He does have mild, unchanged exertional dyspnea with physical activity.  Recent CT scan reviewed, coronary calcium score is a fourth of 2000, and 89th percentile.   Past Medical History:  Diagnosis Date   Arthritis    Colon polyp    GERD (gastroesophageal reflux disease)    Hyperlipemia    Hypertension    Hypothyroidism    Numbness and tingling      Past Surgical History:  Procedure Laterality Date   ACHILLES TENDON LENGTHENING Right 10/21/2016   Procedure: ACHILLES TENDON LENGTHENING;  Surgeon: John Hewitt, MD;  Location: [facility];  Service: Orthopedics;  Laterality: Right;   CHOLECYSTECTOMY     COLONOSCOPY     FOOT SURGERY Right    fracture   HARDWARE REMOVAL Right 10/21/2016   Procedure: HARDWARE REMOVAL;  Surgeon: John Hewitt, MD;  Location: [facility];  Service: Orthopedics;  Laterality: Right;   TOTAL ANKLE ARTHROPLASTY Right 10/21/2016   Procedure: RIGHT TOTAL ANKLE ARTHOPLASTY;  Surgeon: John Hewitt, MD;  Location: [facility];  Service: Orthopedics;  Laterality: Right;  requests at total of 150mins     Social History   Tobacco Use  Smoking Status Former   Packs/day: 7.00   Years: 15.00   Pack years: 105.00   Types: Cigarettes   Quit date: 1995   Years since quitting: 27.9  Smokeless Tobacco Never   Tobacco Comments   quit in late 1998    Social History   Substance and Sexual Activity  Alcohol Use Yes   Alcohol/week: 1.0 standard drink   Types: 1 Shots of liquor per week   Comment: occ     Family History  Problem Relation Age of Onset   Heart disease Mother    Pneumonia Mother    Heart attack Father 78       heart attack     Current Outpatient Medications on File Prior to Visit  Medication Sig Dispense Refill   allopurinol (ZYLOPRIM) 300 MG tablet Take 300 mg by mouth daily.     aspirin EC 81 MG tablet Take 1 tablet (81 mg total) by mouth 2 (two) times daily. (Patient taking differently: Take 81 mg by mouth daily.) 42 tablet 0   atorvastatin (LIPITOR) 40 MG tablet Take 40 mg by mouth daily.     Calcium Carb-Cholecalciferol (CALTRATE 600+D3 SOFT PO) Take by mouth.     Colchicine 0.6 MG CAPS Take 1 capsule by mouth every other day.     denosumab (PROLIA) 60 MG/ML SOLN injection Inject 60 mg into the skin every 6 (six) months. Administer in upper arm, thigh, or abdomen     ezetimibe (ZETIA) 10 MG tablet Take 5 mg by mouth daily.      Levothyroxine Sodium 88 MCG CAPS Take 88 mcg by mouth   daily before breakfast.     Metoprolol Succinate 25 MG CS24 Take by mouth.     Multiple Vitamin (MULTIVITAMIN WITH MINERALS) TABS tablet Take 1 tablet by mouth daily. Centrum silver     Omega-3 Fatty Acids (FISH OIL) 1200 MG CAPS Take 1,200 mg by mouth daily. Mega Red 3     omeprazole (PRILOSEC) 20 MG capsule Take 20 mg by mouth daily before lunch.     valsartan-hydrochlorothiazide (DIOVAN-HCT) 320-25 MG tablet Take 1 tablet by mouth daily.     No current facility-administered medications on file prior to visit.    Cardiovascular and other pertinent studies:  EKG 09/07/2021: Sinus rhythm 60 bpm  Left anterior fascicular block Nonspecific T-abnormality  CT cardiac scoring 08/10/2021: LM: 0 LAD: 952 Lcx: 635 RCA: 894 Total Agatston Score: 2,381 MESA database percentile:  89  Recent labs: 07/15/2021: Glucose 92, BUN/Cr 27/1.2. EGFR 62.  HbA1C 5.3% Chol 180, TG 133, HDL 54, LDL 104 TSH 2.4 normal   Review of Systems  Cardiovascular:  Positive for dyspnea on exertion. Negative for chest pain, leg swelling, palpitations and syncope.        Vitals:   09/07/21 0942  BP: 136/76  Pulse: 61  Resp: 16  Temp: 98 F (36.7 C)  SpO2: 98%     Body mass index is 32.42 kg/m. Filed Weights   09/07/21 0942  Weight: 207 lb (93.9 kg)    Objective:   Physical Exam Vitals and nursing note reviewed.  Constitutional:      General: He is not in acute distress. Neck:     Vascular: No JVD.  Cardiovascular:     Rate and Rhythm: Normal rate and regular rhythm.     Heart sounds: Normal heart sounds. No murmur heard. Pulmonary:     Effort: Pulmonary effort is normal.     Breath sounds: Normal breath sounds. No wheezing or rales.  Musculoskeletal:     Right lower leg: No edema.     Left lower leg: No edema.       Assessment & Recommendations:   75 y.o. Caucasian male with hypertension, hyperlipidemia, elevated coronary calcium score.  Elevated coronary calcium score: >2000, 89th percentile. Recommend Lexiscan nuclear stress test for cardiac risk stratification.  His ability to walk is limited due to right ankle injuries. Increase Lipitor to 80 mg daily, continue Zetia 10 mg daily.  Goal LDL <70. He is currently on colchicine every other day.  He is going to talk to Dr. Aryal to see if he could come off colchicine to reduce risk of myalgias.  In future, if LDL remains uncontrolled, could consider Repatha.  Exertional dyspnea: Mild.  Will obtain echocardiogram.  Thank you for referring the patient to us. Please feel free to contact with any questions.   Manish J Patwardhan, MD Pager: [phone] Office: [phone]  Addendum: I received patient's external records after his visit.  Please see additions below.  ESR 29  Echocardiogram  07/30/2021: Normal LV size.  LVEF 61%.  Diastolic filling with impaired relaxation pattern. Mild aortic sclerosis No pulmonary hypertension  I have canceled scheduled echocardiogram with us.    Manish J Patwardhan, MD Pager: [phone] Office: [phone]  

## 2021-09-10 ENCOUNTER — Ambulatory Visit (INDEPENDENT_AMBULATORY_CARE_PROVIDER_SITE_OTHER): Payer: Medicare Other | Admitting: Gastroenterology

## 2021-09-10 ENCOUNTER — Encounter: Payer: Self-pay | Admitting: Gastroenterology

## 2021-09-10 VITALS — BP 136/72 | HR 71 | Temp 98.4°F | Ht 67.0 in | Wt 200.4 lb

## 2021-09-10 DIAGNOSIS — B351 Tinea unguium: Secondary | ICD-10-CM | POA: Insufficient documentation

## 2021-09-10 DIAGNOSIS — Z87891 Personal history of nicotine dependence: Secondary | ICD-10-CM | POA: Insufficient documentation

## 2021-09-10 DIAGNOSIS — K640 First degree hemorrhoids: Secondary | ICD-10-CM | POA: Diagnosis not present

## 2021-09-10 DIAGNOSIS — R140 Abdominal distension (gaseous): Secondary | ICD-10-CM

## 2021-09-10 DIAGNOSIS — R1010 Upper abdominal pain, unspecified: Secondary | ICD-10-CM

## 2021-09-10 DIAGNOSIS — Z6831 Body mass index (BMI) 31.0-31.9, adult: Secondary | ICD-10-CM | POA: Insufficient documentation

## 2021-09-10 DIAGNOSIS — K219 Gastro-esophageal reflux disease without esophagitis: Secondary | ICD-10-CM | POA: Insufficient documentation

## 2021-09-10 DIAGNOSIS — E731 Secondary lactase deficiency: Secondary | ICD-10-CM | POA: Insufficient documentation

## 2021-09-10 DIAGNOSIS — M810 Age-related osteoporosis without current pathological fracture: Secondary | ICD-10-CM | POA: Insufficient documentation

## 2021-09-10 DIAGNOSIS — I6529 Occlusion and stenosis of unspecified carotid artery: Secondary | ICD-10-CM | POA: Insufficient documentation

## 2021-09-10 DIAGNOSIS — I2510 Atherosclerotic heart disease of native coronary artery without angina pectoris: Secondary | ICD-10-CM | POA: Insufficient documentation

## 2021-09-10 DIAGNOSIS — M1990 Unspecified osteoarthritis, unspecified site: Secondary | ICD-10-CM | POA: Insufficient documentation

## 2021-09-10 DIAGNOSIS — R011 Cardiac murmur, unspecified: Secondary | ICD-10-CM | POA: Insufficient documentation

## 2021-09-10 DIAGNOSIS — L723 Sebaceous cyst: Secondary | ICD-10-CM | POA: Insufficient documentation

## 2021-09-10 DIAGNOSIS — Z9049 Acquired absence of other specified parts of digestive tract: Secondary | ICD-10-CM | POA: Insufficient documentation

## 2021-09-10 DIAGNOSIS — Z86010 Personal history of colonic polyps: Secondary | ICD-10-CM | POA: Insufficient documentation

## 2021-09-10 DIAGNOSIS — G5603 Carpal tunnel syndrome, bilateral upper limbs: Secondary | ICD-10-CM | POA: Insufficient documentation

## 2021-09-10 DIAGNOSIS — Z8781 Personal history of (healed) traumatic fracture: Secondary | ICD-10-CM | POA: Insufficient documentation

## 2021-09-10 DIAGNOSIS — R350 Frequency of micturition: Secondary | ICD-10-CM | POA: Insufficient documentation

## 2021-09-10 DIAGNOSIS — Z800 Family history of malignant neoplasm of digestive organs: Secondary | ICD-10-CM | POA: Insufficient documentation

## 2021-09-10 DIAGNOSIS — K625 Hemorrhage of anus and rectum: Secondary | ICD-10-CM

## 2021-09-10 DIAGNOSIS — E039 Hypothyroidism, unspecified: Secondary | ICD-10-CM | POA: Insufficient documentation

## 2021-09-10 DIAGNOSIS — Z833 Family history of diabetes mellitus: Secondary | ICD-10-CM | POA: Insufficient documentation

## 2021-09-10 DIAGNOSIS — M109 Gout, unspecified: Secondary | ICD-10-CM | POA: Insufficient documentation

## 2021-09-10 DIAGNOSIS — E7800 Pure hypercholesterolemia, unspecified: Secondary | ICD-10-CM | POA: Insufficient documentation

## 2021-09-10 DIAGNOSIS — K529 Noninfective gastroenteritis and colitis, unspecified: Secondary | ICD-10-CM | POA: Insufficient documentation

## 2021-09-10 NOTE — Progress Notes (Signed)
[name], MD [address]  [place]  [place], [place] [address]  Main: [phone]  Fax: [phone]    Gastroenterology Consultation  Referring Provider:     Deland Pretty, MD Primary Care Physician:  Deland Pretty, MD Primary Gastroenterologist:  Dr. Earlean Shawl Reason for Consultation:     Upper abdominal discomfort, abdominal bloating, rectal bleeding        HPI:   Francis Brewer is a 75 y.o. male referred by Dr. Deland Pretty, MD  for consultation & management of upper abdominal discomfort, abdominal bloating and chronic diarrhea, as well as bright red rectal bleeding.  Patient was previously seeing Dr. Earlean Shawl at [facility] decided to move switch his care to [facility].  Patient has known history of chronic diarrhea, he underwent colonoscopy, no evidence of IBD or microscopic colitis, pancreatic fecal elastase, calprotectin normal, celiac panel.  Stool studies were negative for infection.  Has history of hypothyroidism.  Patient was found to have high cholesterol, started following healthy diet, significantly cut back on all the processed foods including red meat, bread.  He did notice significant improvement in his GI symptoms.  He does have history of reflux for which he takes omeprazole 20 mg daily.  He is requesting if he can undergo upper endoscopy.  Patient has lost about 5 to 8 pounds healthy diet.  He has been taking fish oil as well.  Patient underwent hemorrhoid ligation x4 by Dr. Earlean Shawl in 2021 for rectal bleeding He admits to symptoms of bright red blood per rectum on wiping and at the end of bowel movement.  Patient does acknowledge plugging his rectum with toilet paper to prevent bleeding No known history of anemia Patient does not smoke or drink alcohol He admits to consuming 5 drinks per week either wine or gin  NSAIDs: None  Antiplts/Anticoagulants/Anti thrombotics: None  GI Procedures:  Colonoscopy 08/15/2019 FINAL PATHOLOGIC DIAGNOSIS   MICROSCOPIC EXAMINATION AND DIAGNOSIS   A.  "SIGMOID COLON", POLYPECTOMY:       Fragments of tubular adenoma.       Fragment of hyperplastic polyp.   B.  CECUM, BIOPSY:       Colonic mucosa with no significant histopathologic  abnormalities.       No evidence of lymphocytic or collagenous colitis.    C.  "TRANSVERSE COLON", BIOPSY:       Colonic mucosa with no significant histopathologic  abnormalities.       No evidence of lymphocytic or collagenous colitis.   Past Medical History:  Diagnosis Date   Arthritis    Colon polyp    GERD (gastroesophageal reflux disease)    Hyperlipemia    Hypertension    Hypothyroidism    Numbness and tingling     Past Surgical History:  Procedure Laterality Date   ACHILLES TENDON LENGTHENING Right 10/21/2016   Procedure: ACHILLES TENDON LENGTHENING;  Surgeon: Wylene Simmer, MD;  Location: [facility];  Service: Orthopedics;  Laterality: Right;   CHOLECYSTECTOMY     COLONOSCOPY     FOOT SURGERY Right    fracture   HARDWARE REMOVAL Right 10/21/2016   Procedure: HARDWARE REMOVAL;  Surgeon: Wylene Simmer, MD;  Location: [facility];  Service: Orthopedics;  Laterality: Right;   TOTAL ANKLE ARTHROPLASTY Right 10/21/2016   Procedure: RIGHT TOTAL ANKLE ARTHOPLASTY;  Surgeon: Wylene Simmer, MD;  Location: [facility];  Service: Orthopedics;  Laterality: Right;  requests at total of 144mins    Current Outpatient Medications:  allopurinol (ZYLOPRIM) 300 MG tablet, Take 300 mg by mouth daily., Disp: , Rfl:    aspirin EC 81 MG tablet, Take 1 tablet (81 mg total) by mouth 2 (two) times daily. (Patient taking differently: Take 81 mg by mouth daily.), Disp: 42 tablet, Rfl: 0   atorvastatin (LIPITOR) 80 MG tablet, Take 1 tablet (80 mg total) by mouth daily., Disp: 30 tablet, Rfl: 3   Calcium Carb-Cholecalciferol (CALTRATE 600+D3 SOFT PO), Take by mouth., Disp: , Rfl:    Colchicine 0.6 MG CAPS, Take 1 capsule by mouth every other day., Disp: , Rfl:    denosumab (PROLIA) 60 MG/ML  SOLN injection, Inject 60 mg into the skin every 6 (six) months. Administer in upper arm, thigh, or abdomen, Disp: , Rfl:    ezetimibe (ZETIA) 10 MG tablet, Take 5 mg by mouth daily. , Disp: , Rfl:    Levothyroxine Sodium 88 MCG CAPS, Take 88 mcg by mouth daily before breakfast., Disp: , Rfl:    Metoprolol Succinate 25 MG CS24, Take by mouth., Disp: , Rfl:    Multiple Vitamin (MULTIVITAMIN WITH MINERALS) TABS tablet, Take 1 tablet by mouth daily. Centrum silver, Disp: , Rfl:    Omega-3 Fatty Acids (FISH OIL) 1200 MG CAPS, Take 1,200 mg by mouth daily. Mega Red 3, Disp: , Rfl:    omeprazole (PRILOSEC) 20 MG capsule, Take 20 mg by mouth daily before lunch., Disp: , Rfl:    valsartan-hydrochlorothiazide (DIOVAN-HCT) 320-25 MG tablet, Take 1 tablet by mouth daily., Disp: , Rfl:   Family History  Problem Relation Age of Onset   Heart disease Mother    Pneumonia Mother    Heart attack Father 11       heart attack     Social History   Tobacco Use   Smoking status: Former    Packs/day: 7.00    Years: 15.00    Pack years: 105.00    Types: Cigarettes    Quit date: 1995    Years since quitting: 27.9   Smokeless tobacco: Never   Tobacco comments:    quit in late 1998  Vaping Use   Vaping Use: Never used  Substance Use Topics   Alcohol use: Yes    Alcohol/week: 1.0 standard drink    Types: 1 Shots of liquor per week    Comment: occ   Drug use: No    Allergies as of 09/10/2021 - Review Complete 09/10/2021  Allergen Reaction Noted   No known allergies  10/20/2016    Review of Systems:    All systems reviewed and negative except where noted in HPI.   Physical Exam:  BP 136/72 (BP Location: Right Arm, Patient Position: Sitting, Cuff Size: Normal)    Pulse 71    Temp 98.4 F (36.9 C) (Temporal)    Ht 5\' 7"  (1.702 m)    Wt 200 lb 6.4 oz (90.9 kg)    BMI 31.39 kg/m  No LMP for male patient.  General:   Alert,  Well-developed, well-nourished, pleasant and cooperative in NAD Head:   Normocephalic and atraumatic. Eyes:  Sclera clear, no icterus.   Conjunctiva pink. Ears:  Normal auditory acuity. Nose:  No deformity, discharge, or lesions. Mouth:  No deformity or lesions,oropharynx pink & moist. Neck:  Supple; no masses or thyromegaly. Lungs:  Respirations even and unlabored.  Clear throughout to auscultation.   No wheezes, crackles, or rhonchi. No acute distress. Heart:  Regular rate and rhythm; no murmurs, clicks, rubs, or gallops. Abdomen:  Normal bowel sounds. Soft, non-tender and mildly distended, tympanic without masses, hepatosplenomegaly or hernias noted.  No guarding or rebound tenderness.   Rectal: Not performed Msk:  Symmetrical without gross deformities. Good, equal movement & strength bilaterally. Pulses:  Normal pulses noted. Extremities:  No clubbing or edema.  No cyanosis. Neurologic:  Alert and oriented x3;  grossly normal neurologically. Skin:  Intact without significant lesions or rashes. No jaundice. Psych:  Alert and cooperative. Normal mood and affect.  Imaging Studies: None  Assessment and Plan:   Francis Brewer is a 75 y.o. male with history of hypertension, hypothyroidism, s/p cholecystectomy is seen in consultation for chronic symptoms of upper abdominal discomfort, abdominal bloating, history of nonbloody diarrhea.  Colonoscopy including random colon biopsies unremarkable.  Pancreatic fecal elastase levels, calprotectin levels, celiac disease panel, GI pathogen panel, C. difficile toxin unremarkable  Recommend EGD for further evaluation with gastric and duodenal biopsies, Barrett's screening given history of chronic GERD.  If unremarkable, recommend CT abdomen and pelvis with contrast Check CBC, CMP Advised patient to stop fish oil Recommend food allergy profile, gal panel  Rectal bleeding S/p hemorrhoid ligation x4 in 2021 Strongly advised patient to avoid inserting toilet paper inside rectum Discussed with patient about proper toilet  hygiene If his rectal bleeding is persistent, he can either undergo vascular embolization or surgical hemorrhoidectomy   Follow up in 3 to 4 months   [name], MD

## 2021-09-14 ENCOUNTER — Other Ambulatory Visit: Payer: Medicare Other

## 2021-09-14 ENCOUNTER — Other Ambulatory Visit: Payer: Self-pay

## 2021-09-14 ENCOUNTER — Ambulatory Visit: Payer: Medicare Other

## 2021-09-14 DIAGNOSIS — R931 Abnormal findings on diagnostic imaging of heart and coronary circulation: Secondary | ICD-10-CM | POA: Diagnosis not present

## 2021-09-14 DIAGNOSIS — R0609 Other forms of dyspnea: Secondary | ICD-10-CM

## 2021-09-14 LAB — CBC
Hematocrit: 39.1 % (ref 37.5–51.0)
Hemoglobin: 13.3 g/dL (ref 13.0–17.7)
MCH: 33.1 pg — ABNORMAL HIGH (ref 26.6–33.0)
MCHC: 34 g/dL (ref 31.5–35.7)
MCV: 97 fL (ref 79–97)
Platelets: 174 10*3/uL (ref 150–450)
RBC: 4.02 x10E6/uL — ABNORMAL LOW (ref 4.14–5.80)
RDW: 13.6 % (ref 11.6–15.4)
WBC: 9.9 10*3/uL (ref 3.4–10.8)

## 2021-09-14 LAB — FOOD ALLERGY PROFILE
Allergen Corn, IgE: <0.1 kU/L
Clam IgE: <0.1 kU/L
Codfish IgE: <0.1 kU/L
Egg White IgE: <0.1 kU/L
Milk IgE: <0.1 kU/L
Peanut IgE: <0.1 kU/L
Scallop IgE: <0.1 kU/L
Sesame Seed IgE: <0.1 kU/L
Shrimp IgE: <0.1 kU/L
Soybean IgE: <0.1 kU/L
Walnut IgE: <0.1 kU/L
Wheat IgE: <0.1 kU/L

## 2021-09-14 LAB — COMPREHENSIVE METABOLIC PANEL
ALT: 30 IU/L (ref 0–44)
AST: 29 IU/L (ref 0–40)
Albumin/Globulin Ratio: 2.2 (ref 1.2–2.2)
Albumin: 4.8 g/dL — ABNORMAL HIGH (ref 3.7–4.7)
Alkaline Phosphatase: 80 IU/L (ref 44–121)
BUN/Creatinine Ratio: 18 (ref 10–24)
BUN: 21 mg/dL (ref 8–27)
Bilirubin Total: 0.4 mg/dL (ref 0.0–1.2)
CO2: 25 mmol/L (ref 20–29)
Calcium: 10.1 mg/dL (ref 8.6–10.2)
Chloride: 96 mmol/L (ref 96–106)
Creatinine, Ser: 1.18 mg/dL (ref 0.76–1.27)
Globulin, Total: 2.2 g/dL (ref 1.5–4.5)
Glucose: 95 mg/dL (ref 70–99)
Potassium: 4.1 mmol/L (ref 3.5–5.2)
Sodium: 140 mmol/L (ref 134–144)
Total Protein: 7 g/dL (ref 6.0–8.5)
eGFR: 64 mL/min/{1.73_m2} (ref >59)

## 2021-09-14 LAB — ALPHA-GAL PANEL
Allergen Lamb IgE: <0.1 kU/L
Beef IgE: <0.1 kU/L
IgE (Immunoglobulin E), Serum: 99 IU/mL (ref 6–495)
O215-IgE Alpha-Gal: <0.1 kU/L
Pork IgE: <0.1 kU/L

## 2021-09-15 LAB — PCV MYOCARDIAL PERFUSION WITH LEXISCAN
Base ST Depression (mm): 0 mm
ST Depression (mm): 0 mm

## 2021-09-16 ENCOUNTER — Telehealth: Payer: Self-pay

## 2021-09-16 NOTE — Telephone Encounter (Signed)
-----   Message from Lin Landsman, MD sent at 09/14/2021 11:46 AM EST ----- Please inform patient that all his labs including food allergy profile and alpha gal panel came back normal.  I will see him for an upper endoscopy as scheduled on 1/13  Rohini Vanga

## 2021-09-16 NOTE — Telephone Encounter (Signed)
Called patient no answer left detailed message we had to reschedule him till 10/23/2021 I already called endo and sent new communications and sent new referral to nicole

## 2021-09-16 NOTE — Telephone Encounter (Signed)
Called patient about his results he understands and no questions

## 2021-09-17 ENCOUNTER — Ambulatory Visit: Payer: Medicare Other | Admitting: Cardiology

## 2021-10-01 DIAGNOSIS — E7801 Familial hypercholesterolemia: Secondary | ICD-10-CM | POA: Diagnosis not present

## 2021-10-12 DIAGNOSIS — E782 Mixed hyperlipidemia: Secondary | ICD-10-CM | POA: Diagnosis not present

## 2021-10-13 LAB — LIPID PANEL
Chol/HDL Ratio: 3.1 ratio (ref 0.0–5.0)
Cholesterol, Total: 152 mg/dL (ref 100–199)
HDL: 49 mg/dL (ref >39)
LDL Chol Calc (NIH): 88 mg/dL (ref 0–99)
Triglycerides: 78 mg/dL (ref 0–149)
VLDL Cholesterol Cal: 15 mg/dL (ref 5–40)

## 2021-10-13 NOTE — Progress Notes (Signed)
Called pt to inform him about his lab results. Pt mention he would like to wait until he comes back to on 02/16.

## 2021-10-13 NOTE — Progress Notes (Signed)
LDL remains elevated at 88 (>70) on Lipitor 80 nd Zetia 10.  I recommend adding Repatha.  TJ and staff, could you please work on it?  Thanks MJP

## 2021-10-23 ENCOUNTER — Ambulatory Visit: Payer: Medicare Other | Admitting: Certified Registered"

## 2021-10-23 ENCOUNTER — Encounter: Payer: Self-pay | Admitting: Gastroenterology

## 2021-10-23 ENCOUNTER — Ambulatory Visit
Admission: RE | Admit: 2021-10-23 | Discharge: 2021-10-23 | Disposition: A | Discharge status: Home / Self Care | Payer: Medicare Other | Attending: Gastroenterology | Admitting: Gastroenterology

## 2021-10-23 ENCOUNTER — Other Ambulatory Visit: Payer: Self-pay

## 2021-10-23 ENCOUNTER — Encounter
Admission: RE | Disposition: A | Discharge status: Home / Self Care | Payer: Self-pay | Source: Home / Self Care | Attending: Gastroenterology

## 2021-10-23 DIAGNOSIS — K3189 Other diseases of stomach and duodenum: Secondary | ICD-10-CM | POA: Diagnosis not present

## 2021-10-23 DIAGNOSIS — Z87891 Personal history of nicotine dependence: Secondary | ICD-10-CM | POA: Insufficient documentation

## 2021-10-23 DIAGNOSIS — R1010 Upper abdominal pain, unspecified: Secondary | ICD-10-CM | POA: Diagnosis not present

## 2021-10-23 DIAGNOSIS — K219 Gastro-esophageal reflux disease without esophagitis: Secondary | ICD-10-CM | POA: Diagnosis not present

## 2021-10-23 DIAGNOSIS — R197 Diarrhea, unspecified: Secondary | ICD-10-CM | POA: Insufficient documentation

## 2021-10-23 DIAGNOSIS — L539 Erythematous condition, unspecified: Secondary | ICD-10-CM | POA: Diagnosis not present

## 2021-10-23 DIAGNOSIS — K319 Disease of stomach and duodenum, unspecified: Secondary | ICD-10-CM | POA: Diagnosis not present

## 2021-10-23 DIAGNOSIS — K317 Polyp of stomach and duodenum: Secondary | ICD-10-CM

## 2021-10-23 DIAGNOSIS — E669 Obesity, unspecified: Secondary | ICD-10-CM | POA: Diagnosis not present

## 2021-10-23 DIAGNOSIS — R140 Abdominal distension (gaseous): Secondary | ICD-10-CM

## 2021-10-23 DIAGNOSIS — L538 Other specified erythematous conditions: Secondary | ICD-10-CM | POA: Diagnosis not present

## 2021-10-23 DIAGNOSIS — I10 Essential (primary) hypertension: Secondary | ICD-10-CM | POA: Insufficient documentation

## 2021-10-23 DIAGNOSIS — D132 Benign neoplasm of duodenum: Secondary | ICD-10-CM | POA: Insufficient documentation

## 2021-10-23 DIAGNOSIS — Z6830 Body mass index (BMI) 30.0-30.9, adult: Secondary | ICD-10-CM | POA: Diagnosis not present

## 2021-10-23 HISTORY — PX: ESOPHAGOGASTRODUODENOSCOPY (EGD) WITH PROPOFOL: SHX5813

## 2021-10-23 SURGERY — ESOPHAGOGASTRODUODENOSCOPY (EGD) WITH PROPOFOL
Anesthesia: General

## 2021-10-23 MED ORDER — LIDOCAINE HCL (CARDIAC) PF 100 MG/5ML IV SOSY
PREFILLED_SYRINGE | INTRAVENOUS | Status: DC | PRN
  Administered 2021-10-23: 50 mg via INTRAVENOUS

## 2021-10-23 MED ORDER — PROPOFOL 10 MG/ML IV BOLUS
INTRAVENOUS | Status: DC | PRN
  Administered 2021-10-23 (×2): 10 mg via INTRAVENOUS
  Administered 2021-10-23 (×2): 20 mg via INTRAVENOUS
  Administered 2021-10-23: 50 mg via INTRAVENOUS
  Administered 2021-10-23: 10 mg via INTRAVENOUS

## 2021-10-23 MED ORDER — SODIUM CHLORIDE 0.9 % IV SOLN: INTRAVENOUS | Status: DC

## 2021-10-23 MED ORDER — PROPOFOL 10 MG/ML IV BOLUS
INTRAVENOUS | Status: AC
  Filled 2021-10-23: qty 20

## 2021-10-23 NOTE — Anesthesia Postprocedure Evaluation (Signed)
Anesthesia Post Note  Patient: Francis Brewer  Procedure(s) Performed: ESOPHAGOGASTRODUODENOSCOPY (EGD) WITH PROPOFOL  Patient location during evaluation: Endoscopy Anesthesia Type: General Level of consciousness: awake and alert Pain management: pain level controlled Vital Signs Assessment: post-procedure vital signs reviewed and stable Respiratory status: spontaneous breathing, nonlabored ventilation and respiratory function stable Cardiovascular status: blood pressure returned to baseline and stable Postop Assessment: no apparent nausea or vomiting Anesthetic complications: no   No notable events documented.   Last Vitals:  Vitals:   10/23/21 0955 10/23/21 1025  BP:    Pulse:  (!) 54  Resp:  (!) 21  Temp: (!) 35.6 C   SpO2:  98%    Last Pain:  Vitals:   10/23/21 1025  TempSrc:   PainSc: 0-No pain                 Iran Ouch

## 2021-10-23 NOTE — Transfer of Care (Signed)
Immediate Anesthesia Transfer of Care Note  Patient: Francis Brewer  Procedure(s) Performed: ESOPHAGOGASTRODUODENOSCOPY (EGD) WITH PROPOFOL  Patient Location: PACU and Endoscopy Unit  Anesthesia Type:General  Level of Consciousness: awake, drowsy and patient cooperative  Airway & Oxygen Therapy: Patient Spontanous Breathing and Patient connected to nasal cannula oxygen  Post-op Assessment: Report given to RN and Post -op Vital signs reviewed and stable  Post vital signs: Reviewed and stable  Last Vitals:  Vitals Value Taken Time  BP 94/67 10/23/21 0957  Temp 35.6 C 10/23/21 0955  Pulse 60 10/23/21 0958  Resp 18 10/23/21 0958  SpO2 99 % 10/23/21 0958  Vitals shown include unvalidated device data.  Last Pain:  Vitals:   10/23/21 0955  TempSrc: Temporal  PainSc:          Complications: No notable events documented.

## 2021-10-23 NOTE — Op Note (Signed)
[facility] Gastroenterology Patient Name: Francis Brewer Procedure Date: 10/23/2021 9:19 AM MRN: [id number] Account #: [id number] Date of Birth: [birth year] Admit Type: Outpatient Age: 76 Room: [facility] ENDO ROOM 2 Gender: Male Note Status: Finalized Instrument Name: Upper Endoscope [id number] Procedure:             Upper GI endoscopy Indications:           Upper abdominal pain, Abdominal bloating, Diarrhea Providers:             Lin Landsman MD, MD Referring MD:          Deland Pretty (Referring MD) Medicines:             General Anesthesia Complications:         No immediate complications. Estimated blood loss: None. Procedure:             Pre-Anesthesia Assessment:                        - Prior to the procedure, a History and Physical was                         performed, and patient medications and allergies were                         reviewed. The patient is competent. The risks and                         benefits of the procedure and the sedation options and                         risks were discussed with the patient. All questions                         were answered and informed consent was obtained.                         Patient identification and proposed procedure were                         verified by the physician, the nurse, the                         anesthesiologist, the anesthetist and the technician                         in the pre-procedure area in the procedure room in the                         endoscopy suite. Mental Status Examination: alert and                         oriented. Airway Examination: normal oropharyngeal                         airway and neck mobility. Respiratory Examination:                         clear to auscultation. CV Examination: normal.  Prophylactic Antibiotics: The patient does not require                         prophylactic antibiotics. Prior Anticoagulants: The                          patient has taken no previous anticoagulant or                         antiplatelet agents. ASA Grade Assessment: II - A                         patient with mild systemic disease. After reviewing                         the risks and benefits, the patient was deemed in                         satisfactory condition to undergo the procedure. The                         anesthesia plan was to use general anesthesia.                         Immediately prior to administration of medications,                         the patient was re-assessed for adequacy to receive                         sedatives. The heart rate, respiratory rate, oxygen                         saturations, blood pressure, adequacy of pulmonary                         ventilation, and response to care were monitored                         throughout the procedure. The physical status of the                         patient was re-assessed after the procedure.                        After obtaining informed consent, the endoscope was                         passed under direct vision. Throughout the procedure,                         the patient's blood pressure, pulse, and oxygen                         saturations were monitored continuously. The Endoscope                         was introduced through the mouth, and advanced to the  second part of duodenum. The upper GI endoscopy was                         accomplished without difficulty. The patient tolerated                         the procedure well. Findings:      A single 8 mm sessile polyp with no bleeding was found in the second       portion of the duodenum. The polyp was removed with a hot snare.       Resection and retrieval were complete. Estimated blood loss: none.      Normal mucosa was found in the duodenal bulb and in the second portion       of the duodenum. Biopsies were taken with a cold forceps for histology.       Multiple diminutive sessile polyps with no bleeding and no stigmata of       recent bleeding were found in the gastric fundus c/w fundic gland polyps.      Localized moderately erythematous mucosa without bleeding was found on       the greater curvature of the gastric body. Biopsies were taken with a       cold forceps for Helicobacter pylori testing.      The incisura and gastric antrum were normal. Biopsies were taken with a       cold forceps for Helicobacter pylori testing.      Esophagogastric landmarks were identified: the gastroesophageal junction       was found at 38 cm from the incisors.      The gastroesophageal junction and examined esophagus were normal. Impression:            - A single duodenal polyp. Resected and retrieved.                        - Normal mucosa was found in the duodenal bulb and in                         the second portion of the duodenum. Biopsied.                        - Multiple gastric polyps.                        - Erythematous mucosa in the greater curvature of the                         gastric body. Biopsied.                        - Normal incisura and antrum. Biopsied.                        - Esophagogastric landmarks identified.                        - Normal gastroesophageal junction and esophagus. Recommendation:        - Discharge patient to home (with escort).                        - Resume previous diet  today.                        - Continue present medications.                        - Await pathology results. Procedure Code(s):     --- Professional ---                        [phone], Esophagogastroduodenoscopy, flexible,                         transoral; with removal of tumor(s), polyp(s), or                         other lesion(s) by snare technique                        [id number], 26, Esophagogastroduodenoscopy, flexible,                         transoral; with biopsy, single or multiple Diagnosis Code(s):     --- Professional  ---                        K31.7, Polyp of stomach and duodenum                        K31.89, Other diseases of stomach and duodenum                        R10.10, Upper abdominal pain, unspecified                        R19.7, Diarrhea, unspecified                        R14.0, Abdominal distension (gaseous) CPT copyright 2019 American Medical Association. All rights reserved. The codes documented in this report are preliminary and upon coder review may  be revised to meet current compliance requirements. Dr. Ulyess Mort Lin Landsman MD, MD 10/23/2021 9:56:19 AM This report has been signed electronically. Number of Addenda: 0 Note Initiated On: 10/23/2021 9:19 AM Estimated Blood Loss:  Estimated blood loss: none.      [facility]

## 2021-10-23 NOTE — H&P (Signed)
[name], MD [address]  [place]  [place], [place] [address]  Main: [phone]  Fax: [phone] Pager: [phone]  Primary Care Physician:  Deland Pretty, MD Primary Gastroenterologist:  Dr. [name]  Pre-Procedure History & Physical: HPI:  Francis Brewer is a 76 y.o. male is here for an endoscopy.   Past Medical History:  Diagnosis Date   Arthritis    Colon polyp    GERD (gastroesophageal reflux disease)    Hyperlipemia    Hypertension    Hypothyroidism    Numbness and tingling     Past Surgical History:  Procedure Laterality Date   ACHILLES TENDON LENGTHENING Right 10/21/2016   Procedure: ACHILLES TENDON LENGTHENING;  Surgeon: Wylene Simmer, MD;  Location: [place];  Service: Orthopedics;  Laterality: Right;   CHOLECYSTECTOMY     COLONOSCOPY     FOOT SURGERY Right    fracture   HARDWARE REMOVAL Right 10/21/2016   Procedure: HARDWARE REMOVAL;  Surgeon: Wylene Simmer, MD;  Location: [place];  Service: Orthopedics;  Laterality: Right;   TOTAL ANKLE ARTHROPLASTY Right 10/21/2016   Procedure: RIGHT TOTAL ANKLE ARTHOPLASTY;  Surgeon: Wylene Simmer, MD;  Location: [place];  Service: Orthopedics;  Laterality: Right;  requests at total of 162mins    Prior to Admission medications   Medication Sig Start Date End Date Taking? Authorizing Provider  allopurinol (ZYLOPRIM) 300 MG tablet Take 300 mg by mouth daily. 07/29/21  Yes [provider]  aspirin EC 81 MG tablet Take 1 tablet (81 mg total) by mouth 2 (two) times daily. Patient taking differently: Take 81 mg by mouth daily. 10/22/16  Yes Corky Sing, PA-C  atorvastatin (LIPITOR) 80 MG tablet Take 1 tablet (80 mg total) by mouth daily. 09/07/21  Yes Patwardhan, Reynold Bowen, MD  Calcium Carb-Cholecalciferol (CALTRATE 600+D3 SOFT PO) Take by mouth.   Yes [provider]  ezetimibe (ZETIA) 10 MG tablet Take 5 mg by mouth daily.  07/31/16  Yes [provider]  Levothyroxine Sodium 88 MCG  CAPS Take 88 mcg by mouth daily before breakfast. 07/12/16  Yes [provider]  Metoprolol Succinate 25 MG CS24 Take by mouth.   Yes [provider]  Multiple Vitamin (MULTIVITAMIN WITH MINERALS) TABS tablet Take 1 tablet by mouth daily. Centrum silver   Yes [provider]  omeprazole (PRILOSEC) 20 MG capsule Take 20 mg by mouth daily before lunch.   Yes [provider]  valsartan-hydrochlorothiazide (DIOVAN-HCT) 320-25 MG tablet Take 1 tablet by mouth daily. 08/14/16  Yes [provider]  Colchicine 0.6 MG CAPS Take 1 capsule by mouth every other day. Patient not taking: Reported on 10/23/2021 07/29/21   [provider]  denosumab (PROLIA) 60 MG/ML SOLN injection Inject 60 mg into the skin every 6 (six) months. Administer in upper arm, thigh, or abdomen Patient not taking: Reported on 10/23/2021    [provider]  Omega-3 Fatty Acids (FISH OIL) 1200 MG CAPS Take 1,200 mg by mouth daily. Mega Red 3 Patient not taking: Reported on 10/23/2021    [provider]    Allergies as of 09/11/2021 - Review Complete 09/10/2021  Allergen Reaction Noted   No known allergies  10/20/2016    Family History  Problem Relation Age of Onset   Heart disease Mother    Pneumonia Mother    Heart attack Father 33       heart attack    Social History   Socioeconomic History  Marital status: Widowed    Spouse name: Not on file   Number of children: 1   Years of education: HS   Highest education level: Not on file  Occupational History   Occupation: Retired  Tobacco Use   Smoking status: Former    Packs/day: 7.00    Years: 15.00    Pack years: 105.00    Types: Cigarettes    Quit date: 1995    Years since quitting: 28.0   Smokeless tobacco: Never   Tobacco comments:    quit in late 1998  Vaping Use   Vaping Use: Never used  Substance and Sexual Activity   Alcohol use: Yes    Alcohol/week: 1.0 standard drink    Types: 1  Shots of liquor per week    Comment: occ   Drug use: No   Sexual activity: Not on file  Other Topics Concern   Not on file  Social History Narrative   Lives at home with his wife.   Right-handed.   No daily caffeine use.   Social Determinants of Health   Financial Resource Strain: Not on file  Food Insecurity: Not on file  Transportation Needs: Not on file  Physical Activity: Not on file  Stress: Not on file  Social Connections: Not on file  Intimate Partner Violence: Not on file    Review of Systems: See HPI, otherwise negative ROS  Physical Exam: BP 139/70    Pulse (!) 53    Temp (!) 96.2 F (35.7 C) (Temporal)    Resp 16    Ht 5\' 7"  (1.702 m)    Wt 87.5 kg    SpO2 100%    BMI 30.23 kg/m  General:   Alert,  pleasant and cooperative in NAD Head:  Normocephalic and atraumatic. Neck:  Supple; no masses or thyromegaly. Lungs:  Clear throughout to auscultation.    Heart:  Regular rate and rhythm. Abdomen:  Soft, nontender and nondistended. Normal bowel sounds, without guarding, and without rebound.   Neurologic:  Alert and  oriented x4;  grossly normal neurologically.  Impression/Plan: Francis Brewer is here for an endoscopy to be performed for abdominal bloating, upper abdominal discomfort  Risks, benefits, limitations, and alternatives regarding endoscopy have been reviewed with the patient.  Questions have been answered.  All parties agreeable.   Sherri Sear, MD  10/23/2021, 9:21 AM

## 2021-10-23 NOTE — Anesthesia Procedure Notes (Signed)
Procedure Name: MAC Date/Time: 10/23/2021 9:38 AM Performed by: Jerrye Noble, CRNA Pre-anesthesia Checklist: Patient identified, Emergency Drugs available, Suction available and Patient being monitored Patient Re-evaluated:Patient Re-evaluated prior to induction Oxygen Delivery Method: Nasal cannula

## 2021-10-23 NOTE — Anesthesia Preprocedure Evaluation (Signed)
Anesthesia Evaluation  Patient identified by MRN, date of birth, ID band Patient awake    Reviewed: Allergy & Precautions, NPO status , Patient's Chart, lab work & pertinent test results, reviewed documented beta blocker date and time   Airway Mallampati: III  TM Distance: >3 FB Neck ROM: full    Dental  (+) Upper Dentures, Lower Dentures   Pulmonary neg pulmonary ROS, former smoker,    Pulmonary exam normal        Cardiovascular Exercise Tolerance: Good hypertension, Pt. on medications and Pt. on home beta blockers (-) DOE Normal cardiovascular exam     Neuro/Psych negative neurological ROS  negative psych ROS   GI/Hepatic Neg liver ROS, GERD  Medicated and Controlled,Abdominal bloating    Endo/Other  negative endocrine ROS  Renal/GU negative Renal ROS  negative genitourinary   Musculoskeletal  (+) Arthritis ,   Abdominal (+) + obese,   Peds  Hematology negative hematology ROS (+)   Anesthesia Other Findings Past Medical History: No date: Arthritis No date: Colon polyp No date: GERD (gastroesophageal reflux disease) No date: Hyperlipemia No date: Hypertension No date: Hypothyroidism No date: Numbness and tingling  Past Surgical History: 10/21/2016: ACHILLES TENDON LENGTHENING; Right     Comment:  Procedure: ACHILLES TENDON LENGTHENING;  Surgeon: Wylene Simmer, MD;  Location: [place];  Service: Orthopedics;                Laterality: Right; No date: CHOLECYSTECTOMY No date: COLONOSCOPY No date: FOOT SURGERY; Right     Comment:  fracture 10/21/2016: HARDWARE REMOVAL; Right     Comment:  Procedure: HARDWARE REMOVAL;  Surgeon: Wylene Simmer, MD;               Location: [place];  Service: Orthopedics;  Laterality:               Right; 10/21/2016: TOTAL ANKLE ARTHROPLASTY; Right     Comment:  Procedure: RIGHT TOTAL ANKLE ARTHOPLASTY;  Surgeon: Wylene Simmer, MD;  Location: [place];  Service:  Orthopedics;                Laterality: Right;  requests at total of 16mins  BMI    Body Mass Index: 30.23 kg/m      Reproductive/Obstetrics negative OB ROS                             Anesthesia Physical Anesthesia Plan  ASA: 2  Anesthesia Plan: General   Post-op Pain Management:    Induction:   PONV Risk Score and Plan: 2 and Propofol infusion, TIVA and Treatment may vary due to age or medical condition  Airway Management Planned:   Additional Equipment:   Intra-op Plan:   Post-operative Plan:   Informed Consent: I have reviewed the patients History and Physical, chart, labs and discussed the procedure including the risks, benefits and alternatives for the proposed anesthesia with the patient or authorized representative who has indicated his/her understanding and acceptance.     Dental Advisory Given  Plan Discussed with: Anesthesiologist, CRNA and Surgeon  Anesthesia Plan Comments:         Anesthesia Quick Evaluation

## 2021-10-26 LAB — SURGICAL PATHOLOGY

## 2021-10-27 ENCOUNTER — Encounter: Payer: Self-pay | Admitting: Gastroenterology

## 2021-11-12 ENCOUNTER — Ambulatory Visit: Payer: Medicare Other | Admitting: Cardiology

## 2021-11-12 ENCOUNTER — Other Ambulatory Visit: Payer: Self-pay

## 2021-11-12 ENCOUNTER — Encounter: Payer: Self-pay | Admitting: Cardiology

## 2021-11-12 VITALS — BP 130/72 | HR 60 | Temp 98.0°F | Resp 17 | Ht 67.0 in | Wt 199.4 lb

## 2021-11-12 DIAGNOSIS — I2510 Atherosclerotic heart disease of native coronary artery without angina pectoris: Secondary | ICD-10-CM | POA: Diagnosis not present

## 2021-11-12 DIAGNOSIS — E782 Mixed hyperlipidemia: Secondary | ICD-10-CM | POA: Diagnosis not present

## 2021-11-12 MED ORDER — REPATHA SURECLICK 140 MG/ML ~~LOC~~ SOAJ: 140.0000 mg | SUBCUTANEOUS | 5 refills | Status: DC

## 2021-11-12 MED ORDER — EVOLOCUMAB 140 MG/ML ~~LOC~~ SOAJ
140.0000 mg | Freq: Once | SUBCUTANEOUS | Status: AC
  Administered 2021-11-12: 140 mg via SUBCUTANEOUS

## 2021-11-12 NOTE — Progress Notes (Signed)
Patient referred by Merri Brunette, MD for elevated coronary calcium score  Subjective:   Francis Brewer, male    DOB: 08/22/1946, 76 y.o.   MRN: [id number]   Chief Complaint  Patient presents with   Follow-up   Elevated calcium score     HPI  76 y.o. Caucasian male with hypertension, hyperlipidemia, elevated coronary calcium score.  Patient is doing well.  Reviewed recent lipid panel results with the patient, details below.  He is tolerating aspirin, Lipitor, Zetia without any side effects.  Initial consultation visit 08/2019: Patient is retired, lives alone.  His wife passed 4 years ago.  He lives fairly sedentary lifestyle, with physical activity including limited walking.  Physical activity is limited due to prior injuries and surgery in right ankle.  With amount of physical activity, he does not have any complaints of chest pain.  He does have mild, unchanged exertional dyspnea with physical activity.  Recent CT scan reviewed, coronary calcium score is a fourth of 2000, and 89th percentile.   Past Medical History:  Diagnosis Date   Arthritis    Colon polyp    GERD (gastroesophageal reflux disease)    Hyperlipemia    Hypertension    Hypothyroidism    Numbness and tingling      Past Surgical History:  Procedure Laterality Date   ACHILLES TENDON LENGTHENING Right 10/21/2016   Procedure: ACHILLES TENDON LENGTHENING;  Surgeon: Toni Arthurs, MD;  Location[facility];  Service: Orthopedics;  Laterality: Right;   CHOLECYSTECTOMY     COLONOSCOPY     ESOPHAGOGASTRODUODENOSCOPY (EGD) WITH PROPOFOL N/A 10/23/2021   Procedure: ESOPHAGOGASTRODUODENOSCOPY (EGD) WITH PROPOFOL;  Surgeon: Toney Reil, MD;  Location[facility] ENDOSCOPY;  Service: Gastroenterology;  Laterality: N/A;   FOOT SURGERY Right    fracture   HARDWARE REMOVAL Right 10/21/2016   Procedure: HARDWARE REMOVAL;  Surgeon: Toni Arthurs, MD;  [facility];  Service: Orthopedics;  Laterality: Right;   TOTAL ANKLE  ARTHROPLASTY Right 10/21/2016   Procedure: RIGHT TOTAL ANKLE ARTHOPLASTY;  Surgeon: Toni Arthurs, MD;  Location: [facility];  Service: Orthopedics;  Laterality: Right;  requests at total of     Social History   Tobacco Use  Smoking Status Former   Packs/day: 7.00   Years: 15.00   Pack years: 105.00   Types: Cigarettes   Quit date: 1995   Years since quitting: 28.1  Smokeless Tobacco Never  Tobacco Comments   quit in late 1998    Social History   Substance and Sexual Activity  Alcohol Use Yes   Alcohol/week: 1.0 standard drink   Types: 1 Shots of liquor per week   Comment: occ     Family History  Problem Relation Age of Onset   Heart disease Mother    Pneumonia Mother    Heart attack Father 41       heart attack     Current Outpatient Medications on File Prior to Visit  Medication Sig Dispense Refill   allopurinol (ZYLOPRIM) 300 MG tablet Take 300 mg by mouth daily.     aspirin EC 81 MG tablet Take 1 tablet (81 mg total) by mouth 2 (two) times daily. (Patient taking differently: Take 81 mg by mouth daily.) 42 tablet 0   atorvastatin (LIPITOR) 80 MG tablet Take 1 tablet (80 mg total) by mouth daily. 30 tablet 3   Calcium Carb-Cholecalciferol (CALTRATE 600+D3 SOFT PO) Take by mouth.     ezetimibe (ZETIA) 10 MG tablet Take 5  mg by mouth daily.      Levothyroxine Sodium 88 MCG CAPS Take 88 mcg by mouth daily before breakfast.     Metoprolol Succinate 25 MG CS24 Take by mouth.     Multiple Vitamin (MULTIVITAMIN WITH MINERALS) TABS tablet Take 1 tablet by mouth daily. Centrum silver     omeprazole (PRILOSEC) 20 MG capsule Take 20 mg by mouth daily before lunch.     valsartan-hydrochlorothiazide (DIOVAN-HCT) 320-25 MG tablet Take 1 tablet by mouth daily.     No current facility-administered medications on file prior to visit.    Cardiovascular and other pertinent studies:  Exercise nuclear stress test 09/14/2021: Normal ECG stress. The blood pressure response was  normal. The heart rate response was normal.  Myocardial perfusion study suggests mild diaphragmatic attenuation in the inferior wall without ischemia or infarct.  Overall LV systolic function is normal without regional wall motion abnormalities. Stress LV EF: 73%.  No previous exam available for comparison.   EKG 09/07/2021: Sinus rhythm 60 bpm  Left anterior fascicular block Nonspecific T-abnormality  CT cardiac scoring 08/10/2021: LM: 0 LAD: 952 Lcx: 635 RCA: 894 Total Agatston Score: 2,381 MESA database percentile: 89  Echocardiogram 07/30/2021: Normal LV size.  LVEF 61%.  Diastolic filling with impaired relaxation pattern. Mild aortic sclerosis No pulmonary hypertension  Recent labs: 07/15/2021: Glucose 92, BUN/Cr 27/1.2. EGFR 62.  HbA1C 5.3% Chol 180, TG 133, HDL 54, LDL 104 TSH 2.4 normal   Review of Systems  Cardiovascular:  Negative for chest pain, dyspnea on exertion, leg swelling, palpitations and syncope.        Vitals:   11/12/21 1246  BP: 130/72  Pulse: 60  Resp: 17  Temp: 98 F (36.7 C)  SpO2: 98%     Body mass index is 31.23 kg/m. Filed Weights   11/12/21 1246  Weight: 199 lb 6.4 oz (90.4 kg)    Objective:   Physical Exam Vitals and nursing note reviewed.  Constitutional:      General: He is not in acute distress. Neck:     Vascular: No JVD.  Cardiovascular:     Rate and Rhythm: Normal rate and regular rhythm.     Heart sounds: Normal heart sounds. No murmur heard. Pulmonary:     Effort: Pulmonary effort is normal.     Breath sounds: Normal breath sounds. No wheezing or rales.  Musculoskeletal:     Right lower leg: No edema.     Left lower leg: No edema.       Assessment & Recommendations:   76 y.o. Caucasian male with hypertension, hyperlipidemia, elevated coronary calcium score.  Elevated coronary calcium score: >2000, 89th percentile. No ischemia on stress testing 08/2021. Given very high calcium score, continue aspirin  in absence of bleeding issues.   Continue Lipitor to 80 mg daily, Zetia 10 mg daily.  In spite of this, his LDL remains elevated at 88.   Recommend adding Repatha with goal LDL <70.  Repeat lipid panel in May 2023. F/u June 2023.   Nigel Mormon, MD Pager: [phone] Office: [phone]

## 2021-11-14 ENCOUNTER — Encounter: Payer: Self-pay | Admitting: Cardiology

## 2021-11-25 DIAGNOSIS — M6530 Trigger finger, unspecified finger: Secondary | ICD-10-CM | POA: Diagnosis not present

## 2021-11-25 DIAGNOSIS — M1990 Unspecified osteoarthritis, unspecified site: Secondary | ICD-10-CM | POA: Diagnosis not present

## 2021-11-25 DIAGNOSIS — R748 Abnormal levels of other serum enzymes: Secondary | ICD-10-CM | POA: Diagnosis not present

## 2021-11-25 DIAGNOSIS — M109 Gout, unspecified: Secondary | ICD-10-CM | POA: Diagnosis not present

## 2021-11-25 DIAGNOSIS — G5600 Carpal tunnel syndrome, unspecified upper limb: Secondary | ICD-10-CM | POA: Diagnosis not present

## 2021-11-25 DIAGNOSIS — Z8739 Personal history of other diseases of the musculoskeletal system and connective tissue: Secondary | ICD-10-CM | POA: Diagnosis not present

## 2021-11-25 DIAGNOSIS — I10 Essential (primary) hypertension: Secondary | ICD-10-CM | POA: Diagnosis not present

## 2021-11-25 DIAGNOSIS — M810 Age-related osteoporosis without current pathological fracture: Secondary | ICD-10-CM | POA: Diagnosis not present

## 2021-12-02 DIAGNOSIS — M810 Age-related osteoporosis without current pathological fracture: Secondary | ICD-10-CM | POA: Diagnosis not present

## 2021-12-15 ENCOUNTER — Other Ambulatory Visit: Payer: Self-pay

## 2021-12-15 DIAGNOSIS — E782 Mixed hyperlipidemia: Secondary | ICD-10-CM

## 2021-12-15 DIAGNOSIS — I2510 Atherosclerotic heart disease of native coronary artery without angina pectoris: Secondary | ICD-10-CM

## 2021-12-15 MED ORDER — REPATHA SURECLICK 140 MG/ML ~~LOC~~ SOAJ: 140.0000 mg | SUBCUTANEOUS | 5 refills | Status: DC

## 2022-02-08 ENCOUNTER — Other Ambulatory Visit: Payer: Self-pay | Admitting: Cardiology

## 2022-02-17 ENCOUNTER — Ambulatory Visit (INDEPENDENT_AMBULATORY_CARE_PROVIDER_SITE_OTHER): Payer: Medicare Other | Admitting: Gastroenterology

## 2022-02-17 ENCOUNTER — Encounter: Payer: Self-pay | Admitting: Gastroenterology

## 2022-02-17 VITALS — BP 150/84 | HR 80 | Temp 98.4°F | Ht 67.0 in | Wt 197.1 lb

## 2022-02-17 DIAGNOSIS — K625 Hemorrhage of anus and rectum: Secondary | ICD-10-CM

## 2022-02-17 DIAGNOSIS — R140 Abdominal distension (gaseous): Secondary | ICD-10-CM

## 2022-02-17 DIAGNOSIS — K640 First degree hemorrhoids: Secondary | ICD-10-CM | POA: Diagnosis not present

## 2022-02-17 NOTE — Progress Notes (Signed)
[name], MD [address]  [place]  [place], [place] [address]  Main: [phone]  Fax: [phone]    Gastroenterology Consultation  Referring Provider:     Deland Pretty, MD Primary Care Physician:  Deland Pretty, MD Primary Gastroenterologist:  Dr. Earlean Shawl Reason for Consultation:     Upper abdominal discomfort, abdominal bloating, rectal bleeding        HPI:   Francis Brewer is a 76 y.o. male referred by Dr. Deland Pretty, MD  for consultation & management of upper abdominal discomfort, abdominal bloating and chronic diarrhea, as well as bright red rectal bleeding.  Patient was previously seeing Dr. Earlean Shawl at [facility] decided to move switch his care to [facility].  Patient has known history of chronic diarrhea, he underwent colonoscopy, no evidence of IBD or microscopic colitis, pancreatic fecal elastase, calprotectin normal, celiac panel.  Stool studies were negative for infection.  Has history of hypothyroidism.  Patient was found to have high cholesterol, started following healthy diet, significantly cut back on all the processed foods including red meat, bread.  He did notice significant improvement in his GI symptoms.  He does have history of reflux for which he takes omeprazole 20 mg daily.  He is requesting if he can undergo upper endoscopy.  Patient has lost about 5 to 8 pounds healthy diet.  He has been taking fish oil as well.  Patient underwent hemorrhoid ligation x4 by Dr. Earlean Shawl in 2021 for rectal bleeding He admits to symptoms of bright red blood per rectum on wiping and at the end of bowel movement.  Patient does acknowledge plugging his rectum with toilet paper to prevent bleeding No known history of anemia Patient does not smoke or drink alcohol He admits to consuming 5 drinks per week either wine or gin  Follow-up visit 02/17/2022 Patient has been overall doing well with regards to his GI symptoms.  He notices rectal bleeding upon  wiping repeatedly on the.  He underwent upper endoscopy which was unremarkable.  He is trying to lose weight by following healthy diet and calorie restriction.  He has cut back on drinking alcohol, drinks about 2 to 3 glasses during weekends only.  NSAIDs: None  Antiplts/Anticoagulants/Anti thrombotics: None  GI Procedures:  Upper endoscopy 10/23/2021 - A single duodenal polyp. Resected and retrieved. - Normal mucosa was found in the duodenal bulb and in the second portion of the duodenum. Biopsied. - Multiple gastric polyps. - Erythematous mucosa in the greater curvature of the gastric body. Biopsied. - Normal incisura and antrum. Biopsied. - Esophagogastric landmarks identified. - Normal gastroesophageal junction and esophagus.  DIAGNOSIS:  A. DUODENUM; COLD BIOPSY:  - DUODENAL MUCOSA WITH NO SIGNIFICANT PATHOLOGIC ALTERATION.  - NEGATIVE FOR FEATURES OF CELIAC DISEASE.  - NEGATIVE FOR DYSPLASIA AND MALIGNANCY.   B. DUODENUM POLYP; HOT SNARE:  - DUODENAL ADENOMA.  - NEGATIVE FOR HIGH GRADE DYSPLASIA AND MALIGNANCY.   C. STOMACH; COLD BIOPSY:  - OXYNTIC MUCOSA WITH MILD REACTIVE CHANGES.  - NEGATIVE FOR ACTIVE INFLAMMATION AND H PYLORI.  - NEGATIVE FOR INTESTINAL METAPLASIA, DYSPLASIA, AND MALIGNANCY.    Colonoscopy 08/15/2019 FINAL PATHOLOGIC DIAGNOSIS  MICROSCOPIC EXAMINATION AND DIAGNOSIS   A.  "SIGMOID COLON", POLYPECTOMY:       Fragments of tubular adenoma.       Fragment of hyperplastic polyp.   B.  CECUM, BIOPSY:       Colonic mucosa with no significant histopathologic  abnormalities.  No evidence of lymphocytic or collagenous colitis.    C.  "TRANSVERSE COLON", BIOPSY:       Colonic mucosa with no significant histopathologic  abnormalities.       No evidence of lymphocytic or collagenous colitis.   Past Medical History:  Diagnosis Date   Arthritis    Colon polyp    GERD (gastroesophageal reflux disease)    Hyperlipemia    Hypertension     Hypothyroidism    Numbness and tingling     Past Surgical History:  Procedure Laterality Date   ACHILLES TENDON LENGTHENING Right 10/21/2016   Procedure: ACHILLES TENDON LENGTHENING;  Surgeon: Wylene Simmer, MD;  Location: [facility];  Service: Orthopedics;  Laterality: Right;   CHOLECYSTECTOMY     COLONOSCOPY     ESOPHAGOGASTRODUODENOSCOPY (EGD) WITH PROPOFOL N/A 10/23/2021   Procedure: ESOPHAGOGASTRODUODENOSCOPY (EGD) WITH PROPOFOL;  Surgeon: Lin Landsman, MD;  Location: [facility];  Service: Gastroenterology;  Laterality: N/A;   FOOT SURGERY Right    fracture   HARDWARE REMOVAL Right 10/21/2016   Procedure: HARDWARE REMOVAL;  Surgeon: Wylene Simmer, MD;  Location: [facility];  Service: Orthopedics;  Laterality: Right;   TOTAL ANKLE ARTHROPLASTY Right 10/21/2016   Procedure: RIGHT TOTAL ANKLE ARTHOPLASTY;  Surgeon: Wylene Simmer, MD;  Location: [facility];  Service: Orthopedics;  Laterality: Right;  requests at total of 112mns    Current Outpatient Medications:    allopurinol (ZYLOPRIM) 300 MG tablet, Take 300 mg by mouth daily., Disp: , Rfl:    aspirin EC 81 MG tablet, Take 1 tablet (81 mg total) by mouth 2 (two) times daily. (Patient taking differently: Take 81 mg by mouth daily.), Disp: 42 tablet, Rfl: 0   atorvastatin (LIPITOR) 80 MG tablet, TAKE 1 TABLET(80 MG) BY MOUTH DAILY, Disp: 30 tablet, Rfl: 3   Calcium Carb-Cholecalciferol (CALTRATE 600+D3 SOFT PO), Take by mouth., Disp: , Rfl:    colchicine 0.6 MG tablet, Take 0.6 mg by mouth every other day., Disp: , Rfl:    denosumab (PROLIA) 60 MG/ML SOSY injection, Inject 60 mg into the skin every 6 (six) months., Disp: , Rfl:    Evolocumab (REPATHA SURECLICK) 1419MG/ML SOAJ, Inject 140 mg into the skin every 14 (fourteen) days. PA approved 11/13/2021 through 05/18/2022., Disp: 6 mL, Rfl: 5   ezetimibe (ZETIA) 10 MG tablet, Take 5 mg by mouth daily. , Disp: , Rfl:    levothyroxine (SYNTHROID) 88 MCG tablet, Take 88 mcg by mouth every morning.,  Disp: , Rfl:    Metoprolol Succinate 25 MG CS24, Take by mouth., Disp: , Rfl:    Multiple Vitamin (MULTIVITAMIN WITH MINERALS) TABS tablet, Take 1 tablet by mouth daily. Centrum silver, Disp: , Rfl:    omeprazole (PRILOSEC) 20 MG capsule, Take 20 mg by mouth daily before lunch., Disp: , Rfl:    valsartan-hydrochlorothiazide (DIOVAN-HCT) 320-25 MG tablet, Take 1 tablet by mouth daily., Disp: , Rfl:   Family History  Problem Relation Age of Onset   Heart disease Mother    Pneumonia Mother    Heart attack Father 777      heart attack     Social History   Tobacco Use   Smoking status: Former    Packs/day: 7.00    Years: 15.00    Pack years: 105.00    Types: Cigarettes    Quit date: 1995    Years since quitting: 28.4   Smokeless tobacco: Never   Tobacco comments:    quit in late 1998  Vaping Use   Vaping Use: Never used  Substance Use Topics   Alcohol use: Yes    Alcohol/week: 1.0 standard drink    Types: 1 Shots of liquor per week    Comment: occ   Drug use: No    Allergies as of 02/17/2022 - Review Complete 11/14/2021  Allergen Reaction Noted   No known allergies  10/20/2016    Review of Systems:    All systems reviewed and negative except where noted in HPI.   Physical Exam:  BP (!) 150/84 (BP Location: Left Arm, Patient Position: Sitting, Cuff Size: Normal)   Pulse 80   Temp 98.4 F (36.9 C) (Oral)   Ht '5\' 7"'$  (1.702 m)   Wt 197 lb 2 oz (89.4 kg)   BMI 30.87 kg/m  No LMP for male patient.  General:   Alert,  Well-developed, well-nourished, pleasant and cooperative in NAD Head:  Normocephalic and atraumatic. Eyes:  Sclera clear, no icterus.   Conjunctiva pink. Ears:  Normal auditory acuity. Nose:  No deformity, discharge, or lesions. Mouth:  No deformity or lesions,oropharynx pink & moist. Neck:  Supple; no masses or thyromegaly. Lungs:  Respirations even and unlabored.  Clear throughout to auscultation.   No wheezes, crackles, or rhonchi. No acute  distress. Heart:  Regular rate and rhythm; no murmurs, clicks, rubs, or gallops. Abdomen:  Normal bowel sounds. Soft, non-tender and nondistended without masses, hepatosplenomegaly or hernias noted.  No guarding or rebound tenderness.   Rectal: Not performed Msk:  Symmetrical without gross deformities. Good, equal movement & strength bilaterally. Pulses:  Normal pulses noted. Extremities:  No clubbing or edema.  No cyanosis. Neurologic:  Alert and oriented x3;  grossly normal neurologically. Skin:  Intact without significant lesions or rashes. No jaundice. Psych:  Alert and cooperative. Normal mood and affect.  Imaging Studies: None  Assessment and Plan:   Francis Brewer is a 76 y.o. male with history of hypertension, hypothyroidism, s/p cholecystectomy is seen in consultation for chronic symptoms of upper abdominal discomfort, abdominal bloating, history of nonbloody diarrhea.  Colonoscopy including random colon biopsies unremarkable.  Pancreatic fecal elastase levels, calprotectin levels, celiac disease panel, GI pathogen panel, C. difficile toxin unremarkable.  EGD with gastric and duodenal biopsies were unremarkable.  Food allergy profile and alpha gal panel are negative.  Given that patient's symptoms continue to improve, will defer imaging at this time.  If his symptoms are worsening, he will call my office and we will do scheduled CT abdomen and pelvis with contrast  Rectal bleeding: Scant, on wiping only S/p hemorrhoid ligation x4 in 2021 Reiterated about proper toilet hygiene If his rectal bleeding worsens, we can repeat hemorrhoid banding  Follow up as needed   [name], MD

## 2022-02-23 DIAGNOSIS — I2510 Atherosclerotic heart disease of native coronary artery without angina pectoris: Secondary | ICD-10-CM | POA: Diagnosis not present

## 2022-02-23 DIAGNOSIS — E782 Mixed hyperlipidemia: Secondary | ICD-10-CM | POA: Diagnosis not present

## 2022-02-24 LAB — LIPID PANEL
Chol/HDL Ratio: 1.5 ratio (ref 0.0–5.0)
Cholesterol, Total: 75 mg/dL — ABNORMAL LOW (ref 100–199)
HDL: 50 mg/dL (ref >39)
LDL Chol Calc (NIH): 7 mg/dL (ref 0–99)
Triglycerides: 87 mg/dL (ref 0–149)
VLDL Cholesterol Cal: 18 mg/dL (ref 5–40)

## 2022-03-08 ENCOUNTER — Encounter: Payer: Self-pay | Admitting: Cardiology

## 2022-03-08 ENCOUNTER — Ambulatory Visit: Payer: Medicare Other | Admitting: Cardiology

## 2022-03-08 VITALS — BP 130/71 | HR 69 | Temp 98.2°F | Resp 16 | Ht 67.0 in | Wt 202.2 lb

## 2022-03-08 DIAGNOSIS — E782 Mixed hyperlipidemia: Secondary | ICD-10-CM

## 2022-03-08 DIAGNOSIS — I2510 Atherosclerotic heart disease of native coronary artery without angina pectoris: Secondary | ICD-10-CM | POA: Diagnosis not present

## 2022-03-08 DIAGNOSIS — R931 Abnormal findings on diagnostic imaging of heart and coronary circulation: Secondary | ICD-10-CM

## 2022-03-08 MED ORDER — ATORVASTATIN CALCIUM 80 MG PO TABS: 40.0000 mg | ORAL_TABLET | Freq: Every day | ORAL | 0 refills | Status: DC

## 2022-03-08 NOTE — Progress Notes (Signed)
Patient referred by Merri Brunette, MD for elevated coronary calcium score  Subjective:   Francis Brewer, male    DOB: [birth year], 76 y.o.   MRN: [id number]   Chief Complaint  Patient presents with   Coronary Artery Disease   Follow-up     HPI  76 y.o. Caucasian male with hypertension, hyperlipidemia, elevated coronary calcium score.  Patient is doing well.  Reviewed recent lipid panel results with the patient, details below.   Initial consultation visit 08/2019: Patient is retired, lives alone.  His wife passed 4 years ago.  He lives fairly sedentary lifestyle, with physical activity including limited walking.  Physical activity is limited due to prior injuries and surgery in right ankle.  With amount of physical activity, he does not have any complaints of chest pain.  He does have mild, unchanged exertional dyspnea with physical activity.  Recent CT scan reviewed, coronary calcium score is a fourth of 2000, and 89th percentile.     Current Outpatient Medications:    allopurinol (ZYLOPRIM) 300 MG tablet, Take 300 mg by mouth daily., Disp: , Rfl:    aspirin EC 81 MG tablet, Take 1 tablet (81 mg total) by mouth 2 (two) times daily. (Patient taking differently: Take 81 mg by mouth daily.), Disp: 42 tablet, Rfl: 0   atorvastatin (LIPITOR) 80 MG tablet, TAKE 1 TABLET(80 MG) BY MOUTH DAILY, Disp: 30 tablet, Rfl: 3   Calcium Carb-Cholecalciferol (CALTRATE 600+D3 SOFT PO), Take by mouth., Disp: , Rfl:    colchicine 0.6 MG tablet, Take 0.6 mg by mouth every other day., Disp: , Rfl:    denosumab (PROLIA) 60 MG/ML SOSY injection, Inject 60 mg into the skin every 6 (six) months., Disp: , Rfl:    Evolocumab (REPATHA SURECLICK) 140 MG/ML SOAJ, Inject 140 mg into the skin every 14 (fourteen) days. PA approved 11/13/2021 through 05/18/2022., Disp: 6 mL, Rfl: 5   ezetimibe (ZETIA) 10 MG tablet, Take 5 mg by mouth daily. , Disp: , Rfl:    levothyroxine (SYNTHROID) 88 MCG tablet, Take 88 mcg by  mouth every morning., Disp: , Rfl:    Metoprolol Succinate 25 MG CS24, Take by mouth., Disp: , Rfl:    Multiple Vitamin (MULTIVITAMIN WITH MINERALS) TABS tablet, Take 1 tablet by mouth daily. Centrum silver, Disp: , Rfl:    omeprazole (PRILOSEC) 20 MG capsule, Take 20 mg by mouth daily before lunch., Disp: , Rfl:    valsartan-hydrochlorothiazide (DIOVAN-HCT) 320-25 MG tablet, Take 1 tablet by mouth daily., Disp: , Rfl:     Cardiovascular and other pertinent studies:  EKG 03/08/2022: Sinus rhythm 68 bpm  Left axis deviation  Exercise nuclear stress test 09/14/2021: Normal ECG stress. The blood pressure response was normal. The heart rate response was normal.  Myocardial perfusion study suggests mild diaphragmatic attenuation in the inferior wall without ischemia or infarct.  Overall LV systolic function is normal without regional wall motion abnormalities. Stress LV EF: 73%.  No previous exam available for comparison.   EKG 09/07/2021: Sinus rhythm 60 bpm  Left anterior fascicular block Nonspecific T-abnormality  CT cardiac scoring 08/10/2021: LM: 0 LAD: 952 Lcx: 635 RCA: 894 Total Agatston Score: 2,381 MESA database percentile: 89  Echocardiogram 07/30/2021: Normal LV size.  LVEF 61%.  Diastolic filling with impaired relaxation pattern. Mild aortic sclerosis No pulmonary hypertension  Recent labs: 02/23/2022: Chol 75, TG 87, HDL 50, LDL 7  07/15/2021: Glucose 92, BUN/Cr 27/1.2. EGFR 62.  HbA1C 5.3% Chol 180, TG 133, HDL  54, LDL 104 TSH 2.4 normal   Review of Systems  Cardiovascular:  Negative for chest pain, dyspnea on exertion, leg swelling, palpitations and syncope.         Vitals:   03/08/22 1251  BP: 130/71  Pulse: 69  Resp: 16  Temp: 98.2 F (36.8 C)  SpO2: 97%     Body mass index is 31.67 kg/m. Filed Weights   03/08/22 1251  Weight: 202 lb 3.2 oz (91.7 kg)    Objective:   Physical Exam Vitals and nursing note reviewed.  Constitutional:       General: He is not in acute distress. Neck:     Vascular: No JVD.  Cardiovascular:     Rate and Rhythm: Normal rate and regular rhythm.     Heart sounds: Normal heart sounds. No murmur heard. Pulmonary:     Effort: Pulmonary effort is normal.     Breath sounds: Normal breath sounds. No wheezing or rales.  Musculoskeletal:     Right lower leg: No edema.     Left lower leg: No edema.        Assessment & Recommendations:   76 y.o. Caucasian male with hypertension, hyperlipidemia, elevated coronary calcium score.  Elevated coronary calcium score: >2000, 89th percentile. No ischemia on stress testing 08/2021. Given very high calcium score, continue aspirin in absence of bleeding issues.   LDL down to 7 on Repatha, can stop Zetia, reduce Lipitor to 40 mg daily. Repeat lipid panel in [id number].  Hypertension: Controlled  F/u Jan 2024.   Nigel Mormon, MD Pager: [phone] Office: [phone]

## 2022-05-11 ENCOUNTER — Other Ambulatory Visit: Payer: Self-pay | Admitting: Cardiology

## 2022-06-02 DIAGNOSIS — M810 Age-related osteoporosis without current pathological fracture: Secondary | ICD-10-CM | POA: Diagnosis not present

## 2022-06-02 DIAGNOSIS — M1990 Unspecified osteoarthritis, unspecified site: Secondary | ICD-10-CM | POA: Diagnosis not present

## 2022-06-02 DIAGNOSIS — K219 Gastro-esophageal reflux disease without esophagitis: Secondary | ICD-10-CM | POA: Diagnosis not present

## 2022-06-02 DIAGNOSIS — M109 Gout, unspecified: Secondary | ICD-10-CM | POA: Diagnosis not present

## 2022-06-02 DIAGNOSIS — G5600 Carpal tunnel syndrome, unspecified upper limb: Secondary | ICD-10-CM | POA: Diagnosis not present

## 2022-06-02 DIAGNOSIS — Z8739 Personal history of other diseases of the musculoskeletal system and connective tissue: Secondary | ICD-10-CM | POA: Diagnosis not present

## 2022-06-02 DIAGNOSIS — R748 Abnormal levels of other serum enzymes: Secondary | ICD-10-CM | POA: Diagnosis not present

## 2022-06-15 DIAGNOSIS — M810 Age-related osteoporosis without current pathological fracture: Secondary | ICD-10-CM | POA: Diagnosis not present

## 2022-07-19 ENCOUNTER — Other Ambulatory Visit: Payer: Self-pay

## 2022-07-19 MED ORDER — ATORVASTATIN CALCIUM 40 MG PO TABS: 40.0000 mg | ORAL_TABLET | Freq: Every day | ORAL | 0 refills | Status: AC

## 2022-07-21 DIAGNOSIS — Z125 Encounter for screening for malignant neoplasm of prostate: Secondary | ICD-10-CM | POA: Diagnosis not present

## 2022-07-21 DIAGNOSIS — I10 Essential (primary) hypertension: Secondary | ICD-10-CM | POA: Diagnosis not present

## 2022-07-21 DIAGNOSIS — M810 Age-related osteoporosis without current pathological fracture: Secondary | ICD-10-CM | POA: Diagnosis not present

## 2022-07-21 DIAGNOSIS — E039 Hypothyroidism, unspecified: Secondary | ICD-10-CM | POA: Diagnosis not present

## 2022-07-22 DIAGNOSIS — H524 Presbyopia: Secondary | ICD-10-CM | POA: Diagnosis not present

## 2022-07-22 DIAGNOSIS — H5203 Hypermetropia, bilateral: Secondary | ICD-10-CM | POA: Diagnosis not present

## 2022-07-22 DIAGNOSIS — H52223 Regular astigmatism, bilateral: Secondary | ICD-10-CM | POA: Diagnosis not present

## 2022-07-22 DIAGNOSIS — H2513 Age-related nuclear cataract, bilateral: Secondary | ICD-10-CM | POA: Diagnosis not present

## 2022-07-22 DIAGNOSIS — Z135 Encounter for screening for eye and ear disorders: Secondary | ICD-10-CM | POA: Diagnosis not present

## 2022-07-26 DIAGNOSIS — I2510 Atherosclerotic heart disease of native coronary artery without angina pectoris: Secondary | ICD-10-CM | POA: Diagnosis not present

## 2022-07-26 DIAGNOSIS — M810 Age-related osteoporosis without current pathological fracture: Secondary | ICD-10-CM | POA: Diagnosis not present

## 2022-07-26 DIAGNOSIS — Z0000 Encounter for general adult medical examination without abnormal findings: Secondary | ICD-10-CM | POA: Diagnosis not present

## 2022-07-26 DIAGNOSIS — I6529 Occlusion and stenosis of unspecified carotid artery: Secondary | ICD-10-CM | POA: Diagnosis not present

## 2022-07-26 DIAGNOSIS — I10 Essential (primary) hypertension: Secondary | ICD-10-CM | POA: Diagnosis not present

## 2022-07-26 DIAGNOSIS — N1831 Chronic kidney disease, stage 3a: Secondary | ICD-10-CM | POA: Diagnosis not present

## 2022-07-26 DIAGNOSIS — Z1212 Encounter for screening for malignant neoplasm of rectum: Secondary | ICD-10-CM | POA: Diagnosis not present

## 2022-07-26 DIAGNOSIS — M353 Polymyalgia rheumatica: Secondary | ICD-10-CM | POA: Diagnosis not present

## 2022-07-26 DIAGNOSIS — Z23 Encounter for immunization: Secondary | ICD-10-CM | POA: Diagnosis not present

## 2022-07-26 DIAGNOSIS — K529 Noninfective gastroenteritis and colitis, unspecified: Secondary | ICD-10-CM | POA: Diagnosis not present

## 2022-07-26 DIAGNOSIS — K219 Gastro-esophageal reflux disease without esophagitis: Secondary | ICD-10-CM | POA: Diagnosis not present

## 2022-07-26 DIAGNOSIS — M109 Gout, unspecified: Secondary | ICD-10-CM | POA: Diagnosis not present

## 2022-07-26 DIAGNOSIS — E039 Hypothyroidism, unspecified: Secondary | ICD-10-CM | POA: Diagnosis not present

## 2022-08-26 ENCOUNTER — Ambulatory Visit: Payer: Medicare Other

## 2022-08-26 DIAGNOSIS — R0609 Other forms of dyspnea: Secondary | ICD-10-CM

## 2022-08-27 DIAGNOSIS — E782 Mixed hyperlipidemia: Secondary | ICD-10-CM | POA: Diagnosis not present

## 2022-08-27 DIAGNOSIS — I2510 Atherosclerotic heart disease of native coronary artery without angina pectoris: Secondary | ICD-10-CM | POA: Diagnosis not present

## 2022-08-28 LAB — LIPID PANEL
Chol/HDL Ratio: 1.8 ratio (ref 0.0–5.0)
Cholesterol, Total: 83 mg/dL — ABNORMAL LOW (ref 100–199)
HDL: 47 mg/dL (ref >39)
LDL Chol Calc (NIH): 13 mg/dL (ref 0–99)
Triglycerides: 133 mg/dL (ref 0–149)
VLDL Cholesterol Cal: 23 mg/dL (ref 5–40)

## 2022-09-08 ENCOUNTER — Encounter: Payer: Self-pay | Admitting: Cardiology

## 2022-09-08 ENCOUNTER — Ambulatory Visit: Payer: Medicare Other | Admitting: Cardiology

## 2022-09-08 VITALS — BP 123/71 | HR 69 | Resp 16 | Ht 67.0 in | Wt 205.8 lb

## 2022-09-08 DIAGNOSIS — I2510 Atherosclerotic heart disease of native coronary artery without angina pectoris: Secondary | ICD-10-CM

## 2022-09-08 DIAGNOSIS — R931 Abnormal findings on diagnostic imaging of heart and coronary circulation: Secondary | ICD-10-CM | POA: Diagnosis not present

## 2022-09-08 DIAGNOSIS — I10 Essential (primary) hypertension: Secondary | ICD-10-CM | POA: Diagnosis not present

## 2022-09-08 DIAGNOSIS — E782 Mixed hyperlipidemia: Secondary | ICD-10-CM

## 2022-09-08 NOTE — Progress Notes (Signed)
Patient referred by Deland Pretty, MD for elevated coronary calcium score  Subjective:   Francis Brewer, male    DOB: 07/10/1946, 76 y.o.   MRN: [id number]   Chief Complaint  Patient presents with   Coronary Artery Disease   Follow-up     HPI  76 y.o. Caucasian male with hypertension, hyperlipidemia, elevated coronary calcium score.  Patient is doing well.  Reviewed recent lipid panel results with the patient, details below. He is concerned about cost of Repatha with his last prescription.   Initial consultation visit 08/2019: Patient is retired, lives alone.  His wife passed 4 years ago.  He lives fairly sedentary lifestyle, with physical activity including limited walking.  Physical activity is limited due to prior injuries and surgery in right ankle.  With amount of physical activity, he does not have any complaints of chest pain.  He does have mild, unchanged exertional dyspnea with physical activity.  Recent CT scan reviewed, coronary calcium score is a fourth of 2000, and 89th percentile.     Current Outpatient Medications:    allopurinol (ZYLOPRIM) 300 MG tablet, Take 300 mg by mouth daily., Disp: , Rfl:    aspirin EC 81 MG tablet, Take 1 tablet (81 mg total) by mouth 2 (two) times daily. (Patient taking differently: Take 81 mg by mouth daily.), Disp: 42 tablet, Rfl: 0   atorvastatin (LIPITOR) 40 MG tablet, Take 1 tablet (40 mg total) by mouth daily., Disp: 90 tablet, Rfl: 0   Calcium Carb-Cholecalciferol (CALTRATE 600+D3 SOFT PO), Take by mouth., Disp: , Rfl:    Evolocumab (REPATHA SURECLICK) 818 MG/ML SOAJ, Inject 140 mg into the skin every 14 (fourteen) days. PA approved 11/13/2021 through 05/18/2022., Disp: 6 mL, Rfl: 5   levothyroxine (SYNTHROID) 88 MCG tablet, Take 88 mcg by mouth every morning., Disp: , Rfl:    Metoprolol Succinate 25 MG CS24, Take by mouth., Disp: , Rfl:    Multiple Vitamin (MULTIVITAMIN WITH MINERALS) TABS tablet, Take 1 tablet by mouth daily.  Centrum silver, Disp: , Rfl:    omeprazole (PRILOSEC) 20 MG capsule, Take 20 mg by mouth daily before lunch., Disp: , Rfl:    valsartan-hydrochlorothiazide (DIOVAN-HCT) 320-25 MG tablet, Take 1 tablet by mouth as needed., Disp: , Rfl:     Cardiovascular and other pertinent studies:  EKG 09/08/2022: Sinus rhythm 70 bpm  LAFB LVH  Echocardiogram 08/26/2022: Normal LV systolic function with visual EF 60-65%. Left ventricle cavity is normal in size. Mild concentric hypertrophy of the left ventricle. Normal global wall motion. Doppler evidence of grade I (impaired) diastolic dysfunction, normal LAP. Calculated EF 66%. Left atrial cavity is mildly dilated. Right ventricle cavity is mildly dilated. Normal right ventricular function. Trileaflet aortic valve with no regurgitation. Sclerosis of the aortic valve. Trace aortic valve stenosis. Structurally normal tricuspid valve.  Mild tricuspid regurgitation. No evidence of pulmonary hypertension.  Exercise nuclear stress test 09/14/2021: Normal ECG stress. The blood pressure response was normal. The heart rate response was normal.  Myocardial perfusion study suggests mild diaphragmatic attenuation in the inferior wall without ischemia or infarct.  Overall LV systolic function is normal without regional wall motion abnormalities. Stress LV EF: 73%.  No previous exam available for comparison.   CT cardiac scoring 08/10/2021: LM: 0 LAD: 952 Lcx: 635 RCA: 894 Total Agatston Score: 2,381 MESA database percentile: 89  Recent labs: 08/27/2022: Chol 83, TG 133, HDL 47, LDL 13  07/15/2021: Glucose 92, BUN/Cr 27/1.2. EGFR 62.  HbA1C 5.3%  Chol 180, TG 133, HDL 54, LDL 104 TSH 2.4 normal   Review of Systems  Cardiovascular:  Negative for chest pain, dyspnea on exertion, leg swelling, palpitations and syncope.         Vitals:   09/08/22 1251  BP: 123/71  Pulse: 69  Resp: 16  SpO2: 96%     Body mass index is 32.23  kg/m. Filed Weights   09/08/22 1251  Weight: 205 lb 12.8 oz (93.4 kg)    Objective:   Physical Exam Vitals and nursing note reviewed.  Constitutional:      General: He is not in acute distress. Neck:     Vascular: No JVD.  Cardiovascular:     Rate and Rhythm: Normal rate and regular rhythm.     Heart sounds: Normal heart sounds. No murmur heard. Pulmonary:     Effort: Pulmonary effort is normal.     Breath sounds: Normal breath sounds. No wheezing or rales.  Musculoskeletal:     Right lower leg: No edema.     Left lower leg: No edema.        Assessment & Recommendations:   76 y.o. Caucasian male with hypertension, hyperlipidemia, elevated coronary calcium score.  Elevated coronary calcium score: >2000, 89th percentile. No ischemia on stress testing 08/2021. Given very high calcium score, continue aspirin in absence of bleeding issues.   Chol 83, TG 133, HDL 47, LDL 13 (07/2022) on Lipitor 40 mg daily and Repatha. Will work on patient assistance. If cost becomes an issue,will consider switching to Leqvio.  Hypertension: Controlled  F/u in 6 months   Nigel Mormon, MD Pager: [phone] Office: [phone]

## 2022-12-02 DIAGNOSIS — K219 Gastro-esophageal reflux disease without esophagitis: Secondary | ICD-10-CM | POA: Diagnosis not present

## 2022-12-02 DIAGNOSIS — M810 Age-related osteoporosis without current pathological fracture: Secondary | ICD-10-CM | POA: Diagnosis not present

## 2022-12-02 DIAGNOSIS — N289 Disorder of kidney and ureter, unspecified: Secondary | ICD-10-CM | POA: Diagnosis not present

## 2022-12-02 DIAGNOSIS — M1990 Unspecified osteoarthritis, unspecified site: Secondary | ICD-10-CM | POA: Diagnosis not present

## 2022-12-02 DIAGNOSIS — Z8739 Personal history of other diseases of the musculoskeletal system and connective tissue: Secondary | ICD-10-CM | POA: Diagnosis not present

## 2022-12-02 DIAGNOSIS — M109 Gout, unspecified: Secondary | ICD-10-CM | POA: Diagnosis not present

## 2022-12-16 DIAGNOSIS — H5203 Hypermetropia, bilateral: Secondary | ICD-10-CM | POA: Diagnosis not present

## 2022-12-16 DIAGNOSIS — H52223 Regular astigmatism, bilateral: Secondary | ICD-10-CM | POA: Diagnosis not present

## 2022-12-16 DIAGNOSIS — H524 Presbyopia: Secondary | ICD-10-CM | POA: Diagnosis not present

## 2022-12-16 DIAGNOSIS — H2513 Age-related nuclear cataract, bilateral: Secondary | ICD-10-CM | POA: Diagnosis not present

## 2022-12-16 DIAGNOSIS — Z135 Encounter for screening for eye and ear disorders: Secondary | ICD-10-CM | POA: Diagnosis not present

## 2023-01-11 ENCOUNTER — Other Ambulatory Visit: Payer: Self-pay | Admitting: Cardiology

## 2023-01-11 DIAGNOSIS — E782 Mixed hyperlipidemia: Secondary | ICD-10-CM

## 2023-01-11 DIAGNOSIS — I2510 Atherosclerotic heart disease of native coronary artery without angina pectoris: Secondary | ICD-10-CM

## 2023-03-09 ENCOUNTER — Ambulatory Visit: Payer: Medicare Other | Admitting: Cardiology

## 2023-03-09 ENCOUNTER — Encounter: Payer: Self-pay | Admitting: Cardiology

## 2023-03-09 VITALS — BP 104/67 | HR 67 | Resp 16 | Ht 67.0 in | Wt 200.2 lb

## 2023-03-09 DIAGNOSIS — E782 Mixed hyperlipidemia: Secondary | ICD-10-CM | POA: Diagnosis not present

## 2023-03-09 DIAGNOSIS — R931 Abnormal findings on diagnostic imaging of heart and coronary circulation: Secondary | ICD-10-CM | POA: Diagnosis not present

## 2023-03-09 DIAGNOSIS — I2510 Atherosclerotic heart disease of native coronary artery without angina pectoris: Secondary | ICD-10-CM | POA: Diagnosis not present

## 2023-03-09 MED ORDER — REPATHA SURECLICK 140 MG/ML ~~LOC~~ SOAJ: SUBCUTANEOUS | 3 refills | Status: DC

## 2023-03-09 NOTE — Progress Notes (Signed)
Patient referred by Merri Brunette, MD for elevated coronary calcium score  Subjective:   Francis Brewer, male    DOB: 31-Mar-1946, 77 y.o.   MRN: [id number]   Chief Complaint  Patient presents with   Follow-up    6 months   Coronary artery disease involving native coronary artery of     HPI  77 y.o. Caucasian male with hypertension, hyperlipidemia, elevated coronary calcium score.  Patient is doing well.  He walks without any chest pain, but walks due to left knee pain.  He is seeing rheumatology for the same.    Initial consultation visit 08/2019: Patient is retired, lives alone.  His wife passed 4 years ago.  He lives fairly sedentary lifestyle, with physical activity including limited walking.  Physical activity is limited due to prior injuries and surgery in right ankle.  With amount of physical activity, he does not have any complaints of chest pain.  He does have mild, unchanged exertional dyspnea with physical activity.  Recent CT scan reviewed, coronary calcium score is a fourth of 2000, and 89th percentile.     Current Outpatient Medications:    allopurinol (ZYLOPRIM) 300 MG tablet, Take 300 mg by mouth daily., Disp: , Rfl:    aspirin EC 81 MG tablet, Take 1 tablet (81 mg total) by mouth 2 (two) times daily. (Patient taking differently: Take 81 mg by mouth daily.), Disp: 42 tablet, Rfl: 0   atorvastatin (LIPITOR) 40 MG tablet, Take 1 tablet (40 mg total) by mouth daily., Disp: 90 tablet, Rfl: 0   Calcium Carb-Cholecalciferol (CALTRATE 600+D3 SOFT PO), Take by mouth., Disp: , Rfl:    Evolocumab (REPATHA SURECLICK) 140 MG/ML SOAJ, ADMINISTER 1 ML UNDER THE SKIN EVERY 14 DAYS, Disp: 6 mL, Rfl: 3   levothyroxine (SYNTHROID) 88 MCG tablet, Take 88 mcg by mouth every morning., Disp: , Rfl:    Metoprolol Succinate 25 MG CS24, Take by mouth., Disp: , Rfl:    Multiple Vitamin (MULTIVITAMIN WITH MINERALS) TABS tablet, Take 1 tablet by mouth daily. Centrum silver, Disp: , Rfl:     triamcinolone cream (KENALOG) 0.1 %, Apply 1 Application topically 2 (two) times daily. PRN, Disp: , Rfl:    valsartan-hydrochlorothiazide (DIOVAN-HCT) 320-25 MG tablet, Take 1 tablet by mouth as needed., Disp: , Rfl:    zoledronic acid (RECLAST) 5 MG/100ML SOLN injection, Inject 5 mg into the vein once. Once a year, Disp: , Rfl:     Cardiovascular and other pertinent studies:  EKG 03/09/2023: Sinus rhythm 67 bpm Left anterior fascicular block  Echocardiogram 08/26/2022: Normal LV systolic function with visual EF 60-65%. Left ventricle cavity is normal in size. Mild concentric hypertrophy of the left ventricle. Normal global wall motion. Doppler evidence of grade I (impaired) diastolic dysfunction, normal LAP. Calculated EF 66%. Left atrial cavity is mildly dilated. Right ventricle cavity is mildly dilated. Normal right ventricular function. Trileaflet aortic valve with no regurgitation. Sclerosis of the aortic valve. Trace aortic valve stenosis. Structurally normal tricuspid valve.  Mild tricuspid regurgitation. No evidence of pulmonary hypertension.  Exercise nuclear stress test 09/14/2021: Normal ECG stress. The blood pressure response was normal. The heart rate response was normal.  Myocardial perfusion study suggests mild diaphragmatic attenuation in the inferior wall without ischemia or infarct.  Overall LV systolic function is normal without regional wall motion abnormalities. Stress LV EF: 73%.  No previous exam available for comparison.   CT cardiac scoring 08/10/2021: LM: 0 LAD: 952 Lcx: 635 RCA: 894 Total  Agatston Score: 2,381 MESA database percentile: 89  Recent labs: 08/27/2022: Chol 83, TG 133, HDL 47, LDL 13  07/15/2021: Glucose 92, BUN/Cr 27/1.2. EGFR 62.  HbA1C 5.3% Chol 180, TG 133, HDL 54, LDL 104 TSH 2.4 normal   Review of Systems  Cardiovascular:  Negative for chest pain, dyspnea on exertion, leg swelling, palpitations and syncope.   Musculoskeletal:  Positive for joint pain.         Vitals:   03/09/23 1306  BP: 104/67  Pulse: 67  Resp: 16  SpO2: 95%     Body mass index is 31.36 kg/m. Filed Weights   03/09/23 1306  Weight: 200 lb 3.2 oz (90.8 kg)    Objective:   Physical Exam Vitals and nursing note reviewed.  Constitutional:      General: He is not in acute distress. Neck:     Vascular: No JVD.  Cardiovascular:     Rate and Rhythm: Normal rate and regular rhythm.     Heart sounds: Normal heart sounds. No murmur heard. Pulmonary:     Effort: Pulmonary effort is normal.     Breath sounds: Normal breath sounds. No wheezing or rales.  Musculoskeletal:     Right lower leg: No edema.     Left lower leg: No edema.        Assessment & Recommendations:   77 y.o. Caucasian male with hypertension, hyperlipidemia, elevated coronary calcium score.  Elevated coronary calcium score: >2000, 89th percentile. No ischemia on stress testing 08/2021. Given very high calcium score, continue aspirin in absence of bleeding issues.   Chol 83, TG 133, HDL 47, LDL 13 (07/2022) on Lipitor 40 mg daily and Repatha. Continue Repatha.  Could offer samples as he gets it to dermatology within a year.  He will get lipid panel from his PCP in October.  Hypertension: Controlled  F/u in 1 year     Elder Negus, MD Pager: [phone] Office: [phone]

## 2023-06-07 DIAGNOSIS — K219 Gastro-esophageal reflux disease without esophagitis: Secondary | ICD-10-CM | POA: Diagnosis not present

## 2023-06-07 DIAGNOSIS — N289 Disorder of kidney and ureter, unspecified: Secondary | ICD-10-CM | POA: Diagnosis not present

## 2023-06-07 DIAGNOSIS — M109 Gout, unspecified: Secondary | ICD-10-CM | POA: Diagnosis not present

## 2023-06-07 DIAGNOSIS — Z8739 Personal history of other diseases of the musculoskeletal system and connective tissue: Secondary | ICD-10-CM | POA: Diagnosis not present

## 2023-06-07 DIAGNOSIS — M810 Age-related osteoporosis without current pathological fracture: Secondary | ICD-10-CM | POA: Diagnosis not present

## 2023-06-07 DIAGNOSIS — M1990 Unspecified osteoarthritis, unspecified site: Secondary | ICD-10-CM | POA: Diagnosis not present

## 2023-06-07 DIAGNOSIS — Z23 Encounter for immunization: Secondary | ICD-10-CM | POA: Diagnosis not present

## 2023-06-16 DIAGNOSIS — M810 Age-related osteoporosis without current pathological fracture: Secondary | ICD-10-CM | POA: Diagnosis not present

## 2023-07-28 DIAGNOSIS — E039 Hypothyroidism, unspecified: Secondary | ICD-10-CM | POA: Diagnosis not present

## 2023-07-28 DIAGNOSIS — I10 Essential (primary) hypertension: Secondary | ICD-10-CM | POA: Diagnosis not present

## 2023-07-28 DIAGNOSIS — M810 Age-related osteoporosis without current pathological fracture: Secondary | ICD-10-CM | POA: Diagnosis not present

## 2023-08-01 DIAGNOSIS — D539 Nutritional anemia, unspecified: Secondary | ICD-10-CM | POA: Diagnosis not present

## 2023-08-01 DIAGNOSIS — M353 Polymyalgia rheumatica: Secondary | ICD-10-CM | POA: Diagnosis not present

## 2023-08-01 DIAGNOSIS — M109 Gout, unspecified: Secondary | ICD-10-CM | POA: Diagnosis not present

## 2023-08-01 DIAGNOSIS — K219 Gastro-esophageal reflux disease without esophagitis: Secondary | ICD-10-CM | POA: Diagnosis not present

## 2023-08-01 DIAGNOSIS — Z0000 Encounter for general adult medical examination without abnormal findings: Secondary | ICD-10-CM | POA: Diagnosis not present

## 2023-08-01 DIAGNOSIS — I10 Essential (primary) hypertension: Secondary | ICD-10-CM | POA: Diagnosis not present

## 2023-08-01 DIAGNOSIS — M810 Age-related osteoporosis without current pathological fracture: Secondary | ICD-10-CM | POA: Diagnosis not present

## 2023-08-01 DIAGNOSIS — I6529 Occlusion and stenosis of unspecified carotid artery: Secondary | ICD-10-CM | POA: Diagnosis not present

## 2023-08-01 DIAGNOSIS — I2510 Atherosclerotic heart disease of native coronary artery without angina pectoris: Secondary | ICD-10-CM | POA: Diagnosis not present

## 2023-08-01 DIAGNOSIS — E039 Hypothyroidism, unspecified: Secondary | ICD-10-CM | POA: Diagnosis not present

## 2023-08-01 DIAGNOSIS — N1831 Chronic kidney disease, stage 3a: Secondary | ICD-10-CM | POA: Diagnosis not present

## 2023-08-02 DIAGNOSIS — N1831 Chronic kidney disease, stage 3a: Secondary | ICD-10-CM | POA: Diagnosis not present

## 2023-10-31 DIAGNOSIS — N1831 Chronic kidney disease, stage 3a: Secondary | ICD-10-CM | POA: Diagnosis not present

## 2023-11-02 DIAGNOSIS — J111 Influenza due to unidentified influenza virus with other respiratory manifestations: Secondary | ICD-10-CM | POA: Diagnosis not present

## 2023-11-02 DIAGNOSIS — J029 Acute pharyngitis, unspecified: Secondary | ICD-10-CM | POA: Diagnosis not present

## 2023-11-02 DIAGNOSIS — R059 Cough, unspecified: Secondary | ICD-10-CM | POA: Diagnosis not present

## 2023-11-30 DIAGNOSIS — R0602 Shortness of breath: Secondary | ICD-10-CM | POA: Diagnosis not present

## 2023-11-30 DIAGNOSIS — E6609 Other obesity due to excess calories: Secondary | ICD-10-CM | POA: Diagnosis not present

## 2023-11-30 DIAGNOSIS — R059 Cough, unspecified: Secondary | ICD-10-CM | POA: Diagnosis not present

## 2023-11-30 DIAGNOSIS — J209 Acute bronchitis, unspecified: Secondary | ICD-10-CM | POA: Diagnosis not present

## 2023-11-30 DIAGNOSIS — R062 Wheezing: Secondary | ICD-10-CM | POA: Diagnosis not present

## 2023-12-06 DIAGNOSIS — Z79899 Other long term (current) drug therapy: Secondary | ICD-10-CM | POA: Diagnosis not present

## 2023-12-06 DIAGNOSIS — N289 Disorder of kidney and ureter, unspecified: Secondary | ICD-10-CM | POA: Diagnosis not present

## 2023-12-06 DIAGNOSIS — M810 Age-related osteoporosis without current pathological fracture: Secondary | ICD-10-CM | POA: Diagnosis not present

## 2023-12-06 DIAGNOSIS — M1990 Unspecified osteoarthritis, unspecified site: Secondary | ICD-10-CM | POA: Diagnosis not present

## 2023-12-06 DIAGNOSIS — K219 Gastro-esophageal reflux disease without esophagitis: Secondary | ICD-10-CM | POA: Diagnosis not present

## 2023-12-06 DIAGNOSIS — M109 Gout, unspecified: Secondary | ICD-10-CM | POA: Diagnosis not present

## 2023-12-06 DIAGNOSIS — Z8739 Personal history of other diseases of the musculoskeletal system and connective tissue: Secondary | ICD-10-CM | POA: Diagnosis not present

## 2023-12-13 DIAGNOSIS — I10 Essential (primary) hypertension: Secondary | ICD-10-CM | POA: Diagnosis not present

## 2023-12-13 DIAGNOSIS — R053 Chronic cough: Secondary | ICD-10-CM | POA: Diagnosis not present

## 2023-12-13 DIAGNOSIS — R0602 Shortness of breath: Secondary | ICD-10-CM | POA: Diagnosis not present

## 2023-12-13 DIAGNOSIS — K219 Gastro-esophageal reflux disease without esophagitis: Secondary | ICD-10-CM | POA: Diagnosis not present

## 2023-12-13 DIAGNOSIS — Q791 Other congenital malformations of diaphragm: Secondary | ICD-10-CM | POA: Diagnosis not present

## 2023-12-19 DIAGNOSIS — I10 Essential (primary) hypertension: Secondary | ICD-10-CM | POA: Diagnosis not present

## 2023-12-20 DIAGNOSIS — R351 Nocturia: Secondary | ICD-10-CM | POA: Diagnosis not present

## 2023-12-20 DIAGNOSIS — R3915 Urgency of urination: Secondary | ICD-10-CM | POA: Diagnosis not present

## 2023-12-20 DIAGNOSIS — N1831 Chronic kidney disease, stage 3a: Secondary | ICD-10-CM | POA: Diagnosis not present

## 2023-12-20 DIAGNOSIS — R350 Frequency of micturition: Secondary | ICD-10-CM | POA: Diagnosis not present

## 2023-12-20 DIAGNOSIS — I10 Essential (primary) hypertension: Secondary | ICD-10-CM | POA: Diagnosis not present

## 2023-12-23 DIAGNOSIS — Z125 Encounter for screening for malignant neoplasm of prostate: Secondary | ICD-10-CM | POA: Diagnosis not present

## 2024-01-12 DIAGNOSIS — R053 Chronic cough: Secondary | ICD-10-CM | POA: Diagnosis not present

## 2024-01-16 DIAGNOSIS — R053 Chronic cough: Secondary | ICD-10-CM | POA: Diagnosis not present

## 2024-01-16 DIAGNOSIS — Q791 Other congenital malformations of diaphragm: Secondary | ICD-10-CM | POA: Diagnosis not present

## 2024-01-17 DIAGNOSIS — R972 Elevated prostate specific antigen [PSA]: Secondary | ICD-10-CM | POA: Diagnosis not present

## 2024-01-24 DIAGNOSIS — Q791 Other congenital malformations of diaphragm: Secondary | ICD-10-CM | POA: Diagnosis not present

## 2024-01-24 DIAGNOSIS — R9389 Abnormal findings on diagnostic imaging of other specified body structures: Secondary | ICD-10-CM | POA: Diagnosis not present

## 2024-03-06 ENCOUNTER — Other Ambulatory Visit: Payer: Self-pay | Admitting: Cardiology

## 2024-03-06 DIAGNOSIS — I2510 Atherosclerotic heart disease of native coronary artery without angina pectoris: Secondary | ICD-10-CM

## 2024-03-06 DIAGNOSIS — E782 Mixed hyperlipidemia: Secondary | ICD-10-CM

## 2024-03-08 ENCOUNTER — Ambulatory Visit: Payer: Self-pay | Admitting: Cardiology

## 2024-04-18 ENCOUNTER — Ambulatory Visit: Attending: Cardiovascular Disease | Admitting: Cardiology

## 2024-04-18 ENCOUNTER — Encounter: Payer: Self-pay | Admitting: Cardiology

## 2024-04-18 VITALS — BP 136/78 | HR 80 | Ht 67.0 in | Wt 210.8 lb

## 2024-04-18 DIAGNOSIS — R011 Cardiac murmur, unspecified: Secondary | ICD-10-CM | POA: Insufficient documentation

## 2024-04-18 DIAGNOSIS — I2510 Atherosclerotic heart disease of native coronary artery without angina pectoris: Secondary | ICD-10-CM | POA: Insufficient documentation

## 2024-04-18 DIAGNOSIS — I10 Essential (primary) hypertension: Secondary | ICD-10-CM | POA: Insufficient documentation

## 2024-04-18 DIAGNOSIS — E782 Mixed hyperlipidemia: Secondary | ICD-10-CM | POA: Diagnosis not present

## 2024-04-18 NOTE — Progress Notes (Signed)
 Cardiology Office Note:  .   Date:  04/18/2024  ID:  [name], DOB 12/14/1945, MRN [id number] PCP: Francis Nottingham, MD  [facility] Providers Cardiologist:  Francis Lawrence, MD PCP: Francis Nottingham, MD  Chief Complaint  Patient presents with   Coronary Artery Disease     Francis Brewer is a 78 y.o. male with hypertension, hyperlipidemia, elevated coronary calcium  score.    History of Present Illness  Patient is doing well.  Physical activity is limited due to musculoskeletal issues.  He has any complaints of chest pain or shortness of breath.  Blood pressure is well-controlled.  LDL was 6 in 06/2023 on Repatha .    Vitals:   04/18/24 1255  BP: 136/78  Pulse: 80  SpO2: 94%      Review of Systems  Cardiovascular:  Negative for chest pain, dyspnea on exertion, leg swelling, palpitations and syncope.        Studies Reviewed: SABRA        EKG 04/18/2024: Normal sinus rhythm Left anterior fascicular block Moderate voltage criteria for LVH, may be normal variant ( R in aVL , Cornell product ) Cannot rule out Anterior infarct , age undetermined No previous ECGs available  Labs 06/2023: Chol 87, TG 227, HDL 47, LDL 6 Hb 12.9 Cr 1.39 TSH 2.7  Echocardiogram 08/26/2022: Normal LV systolic function with visual EF 60-65%. Left ventricle cavity is normal in size. Mild concentric hypertrophy of the left ventricle. Normal global wall motion. Doppler evidence of grade I (impaired) diastolic dysfunction, normal LAP. Calculated EF 66%. Left atrial cavity is mildly dilated. Right ventricle cavity is mildly dilated. Normal right ventricular function. Trileaflet aortic valve with no regurgitation. Sclerosis of the aortic valve. Trace aortic valve stenosis. Structurally normal tricuspid valve.  Mild tricuspid regurgitation. No evidence of pulmonary hypertension.   Exercise nuclear stress test 09/14/2021: Normal ECG stress. The blood pressure response was  normal. The heart rate response was normal.  Myocardial perfusion study suggests mild diaphragmatic attenuation in the inferior wall without ischemia or infarct.  Overall LV systolic function is normal without regional wall motion abnormalities. Stress LV EF: 73%.  No previous exam available for comparison.   CT cardiac scoring 08/10/2021: LM: 0 LAD: 952 Lcx: 635 RCA: 894 Total Agatston Score: 2,381 MESA database percentile: 75    Physical Exam Vitals and nursing note reviewed.  Constitutional:      General: He is not in acute distress. Neck:     Vascular: No JVD.  Cardiovascular:     Rate and Rhythm: Normal rate and regular rhythm.     Heart sounds: Murmur heard.     Harsh midsystolic murmur is present with a grade of 2/6 at the upper right sternal border radiating to the neck.  Pulmonary:     Effort: Pulmonary effort is normal.     Breath sounds: Normal breath sounds. No wheezing or rales.  Musculoskeletal:     Right lower leg: No edema.     Left lower leg: No edema.      VISIT DIAGNOSES:   ICD-10-CM   1. 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 Mixed hyperlipidemia  E78.2 EKG 12-Lead    3. Essential hypertension  I10 EKG 12-Lead       Francis Brewer is a 78 y.o. male with hypertension, hyperlipidemia, elevated coronary calcium  score.   Assessment & Plan  Elevated coronary calcium  score: >  2000, 89th percentile. No ischemia on stress testing 08/2021. Given very high calcium  score, continue aspirin  in absence of bleeding issues.   Continue Repatha  and Lipitor 40 mg daily.  LDL 6 in 06/2023. Triglycerides were 227.  Recommend reducing simple carbohydrate and high saturated fat diet.  Repeat fasting lipid panel with PCP Dr. Clarice in October.  If triglycerides remain presents in 200, could consider adding fenofibrate or Vascepa.  Hypertension: Controlled  Murmur: Aortic  sclerosis noted in 07/2022.  Will repeat echocardiogram.      F/u in 1 year  Signed, Francis JINNY Lawrence, MD

## 2024-04-18 NOTE — Patient Instructions (Signed)
 Testing/Procedures: Your physician has requested that you have an echocardiogram. Echocardiography is a painless test that uses sound waves to create images of your heart. It provides your doctor with information about the size and shape of your heart and how well your heart's chambers and valves are working. This procedure takes approximately one hour. There are no restrictions for this procedure. Please do NOT wear cologne, perfume, aftershave, or lotions (deodorant is allowed). Please arrive 15 minutes prior to your appointment time.  Please note: We ask at that you not bring children with you during ultrasound (echo/ vascular) testing. Due to room size and safety concerns, children are not allowed in the ultrasound rooms during exams. Our front office staff cannot provide observation of children in our lobby area while testing is being conducted. An adult accompanying a patient to their appointment will only be allowed in the ultrasound room at the discretion of the ultrasound technician under special circumstances. We apologize for any inconvenience.   Follow-Up: At [facility], you and your health needs are our priority.  As part of our continuing mission to provide you with exceptional heart care, our providers are all part of one team.  This team includes your primary Cardiologist (physician) and Advanced Practice Providers or APPs (Physician Assistants and Nurse Practitioners) who all work together to provide you with the care you need, when you need it.  Your next appointment:   1 year(s)  Provider:   Newman JINNY Lawrence, MD

## 2024-05-23 ENCOUNTER — Ambulatory Visit (HOSPITAL_COMMUNITY)
Admission: RE | Admit: 2024-05-23 | Discharge: 2024-05-23 | Disposition: A | Discharge status: Home / Self Care | Source: Ambulatory Visit | Attending: Cardiology | Admitting: Cardiology

## 2024-05-23 DIAGNOSIS — R011 Cardiac murmur, unspecified: Secondary | ICD-10-CM | POA: Diagnosis not present

## 2024-05-23 LAB — ECHOCARDIOGRAM COMPLETE
AR max vel: 1.49 cm2
AV Area VTI: 1.78 cm2
AV Area mean vel: 1.85 cm2
AV Mean grad: 25 mmHg
AV Peak grad: 42 mmHg
Ao pk vel: 3.24 m/s
Area-P 1/2: 3.24 cm2
S' Lateral: 3.2 cm

## 2024-05-24 ENCOUNTER — Ambulatory Visit: Payer: Self-pay | Admitting: Cardiology

## 2024-05-24 DIAGNOSIS — I351 Nonrheumatic aortic (valve) insufficiency: Secondary | ICD-10-CM

## 2024-05-24 DIAGNOSIS — R011 Cardiac murmur, unspecified: Secondary | ICD-10-CM

## 2024-05-24 DIAGNOSIS — R0609 Other forms of dyspnea: Secondary | ICD-10-CM

## 2024-05-24 NOTE — Progress Notes (Signed)
Unable to leave message, will try again

## 2024-05-24 NOTE — Progress Notes (Signed)
 Normal pumping function of the heart. Aortic valve has moderate narrowing, which is new since 2023. This will need closer monitoring. Recommend repeat echocardiogram in 11/2023.   In absence of symptoms, please check if patient would be willing to consider participating in a clinical trial of a new drug that could potentially slow down progression of aortic stenosis (Trial Katalyst AV). If yes, please forward his information to Dr. Wendel or his nurse.  Thanks MJP

## 2024-05-30 ENCOUNTER — Telehealth: Payer: Self-pay | Admitting: Cardiology

## 2024-05-30 NOTE — Telephone Encounter (Signed)
 Patient returning Brook's call in regards to echo results

## 2024-05-30 NOTE — Telephone Encounter (Signed)
 Pt returned call. I'm unsure what the call was regarding. Will get message to [facility] to clarify. Pt thankful for the return call and will wait for Brooke to call back.

## 2024-06-12 ENCOUNTER — Other Ambulatory Visit: Payer: Self-pay | Admitting: Cardiology

## 2024-06-12 ENCOUNTER — Telehealth (HOSPITAL_COMMUNITY): Payer: Self-pay | Admitting: *Deleted

## 2024-06-12 ENCOUNTER — Encounter (HOSPITAL_COMMUNITY): Payer: Self-pay | Admitting: *Deleted

## 2024-06-12 DIAGNOSIS — M1990 Unspecified osteoarthritis, unspecified site: Secondary | ICD-10-CM | POA: Diagnosis not present

## 2024-06-12 DIAGNOSIS — N289 Disorder of kidney and ureter, unspecified: Secondary | ICD-10-CM | POA: Diagnosis not present

## 2024-06-12 DIAGNOSIS — M810 Age-related osteoporosis without current pathological fracture: Secondary | ICD-10-CM | POA: Diagnosis not present

## 2024-06-12 DIAGNOSIS — Z79899 Other long term (current) drug therapy: Secondary | ICD-10-CM | POA: Diagnosis not present

## 2024-06-12 DIAGNOSIS — Z8739 Personal history of other diseases of the musculoskeletal system and connective tissue: Secondary | ICD-10-CM | POA: Diagnosis not present

## 2024-06-12 DIAGNOSIS — M109 Gout, unspecified: Secondary | ICD-10-CM | POA: Diagnosis not present

## 2024-06-12 DIAGNOSIS — R0609 Other forms of dyspnea: Secondary | ICD-10-CM

## 2024-06-12 DIAGNOSIS — K219 Gastro-esophageal reflux disease without esophagitis: Secondary | ICD-10-CM | POA: Diagnosis not present

## 2024-06-12 NOTE — Telephone Encounter (Signed)
 Attempted to call patient regarding upcoming appointment- no answer, unable to leave a message. Letter sent with instructions to my chart.  Claudene Ronal Quale, RN

## 2024-06-14 ENCOUNTER — Ambulatory Visit (HOSPITAL_COMMUNITY)
Admission: RE | Admit: 2024-06-14 | Discharge: 2024-06-14 | Disposition: A | Discharge status: Home / Self Care | Source: Ambulatory Visit | Attending: Internal Medicine | Admitting: Internal Medicine

## 2024-06-14 DIAGNOSIS — R0609 Other forms of dyspnea: Secondary | ICD-10-CM | POA: Diagnosis not present

## 2024-06-14 LAB — MYOCARDIAL PERFUSION IMAGING
LV dias vol: 53 mL (ref 62–150)
LV sys vol: 15 mL (ref 4.2–5.8)
Nuc Stress EF: 72 %
Peak HR: 98 {beats}/min
Rest HR: 75 {beats}/min
Rest Nuclear Isotope Dose: 10.3 mCi
SDS: 0
SRS: 0
SSS: 0
ST Depression (mm): 0 mm
Stress Nuclear Isotope Dose: 32.2 mCi
TID: 0.87

## 2024-06-14 MED ORDER — REGADENOSON 0.4 MG/5ML IV SOLN
0.4000 mg | Freq: Once | INTRAVENOUS | Status: AC
  Administered 2024-06-14: 0.4 mg via INTRAVENOUS

## 2024-06-14 MED ORDER — REGADENOSON 0.4 MG/5ML IV SOLN
INTRAVENOUS | Status: AC
  Filled 2024-06-14: qty 5

## 2024-06-14 MED ORDER — TECHNETIUM TC 99M TETROFOSMIN IV KIT
32.2000 | PACK | Freq: Once | INTRAVENOUS | Status: AC | PRN
  Administered 2024-06-14: 32.2 via INTRAVENOUS

## 2024-06-14 MED ORDER — TECHNETIUM TC 99M TETROFOSMIN IV KIT
10.3000 | PACK | Freq: Once | INTRAVENOUS | Status: AC | PRN
  Administered 2024-06-14: 10.3 via INTRAVENOUS

## 2024-06-15 ENCOUNTER — Ambulatory Visit: Payer: Self-pay | Admitting: Cardiology

## 2024-06-15 NOTE — Progress Notes (Signed)
 Only mild ischemia on stress test.  In absence of any chest pain symptoms, I do not think heart catheterization is necessary at this time.  If shortness of breath worsens over period time, we could consider heart catheterization if the shortness of breath is due to any artery narrowings.  With this result, I would like to see him back in January instead waiting till July 2026 for next appointment.  Thanks MJP

## 2024-06-17 ENCOUNTER — Ambulatory Visit: Payer: Self-pay | Admitting: Cardiology

## 2024-06-17 NOTE — Progress Notes (Signed)
 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 Will convey the findings to PCP Dr. Clarice as well. May need repeat CT scan in 1 year.  Thanks MJP

## 2024-06-18 ENCOUNTER — Telehealth: Payer: Self-pay | Admitting: Cardiology

## 2024-06-18 DIAGNOSIS — M810 Age-related osteoporosis without current pathological fracture: Secondary | ICD-10-CM | POA: Diagnosis not present

## 2024-06-18 NOTE — Telephone Encounter (Signed)
 Pt had unexpected findings on heart overreading.Please call even if you've seen report. Please advise

## 2024-06-18 NOTE — Telephone Encounter (Signed)
 Spoke with Channing and advised report has been received.

## 2024-06-20 NOTE — Progress Notes (Signed)
 Attempted to contact patient, unable to leave message.

## 2024-06-21 NOTE — Progress Notes (Signed)
 Attempted to contact patient, unable to leave message.

## 2024-06-25 NOTE — Progress Notes (Signed)
 Attempted to contact patient, unable to leave message. Will mail letter for patient to contact office for results.

## 2024-06-26 NOTE — Telephone Encounter (Signed)
 Pt was out of town and requesting a c/b to go over results.

## 2024-07-18 ENCOUNTER — Encounter: Payer: Self-pay | Admitting: Internal Medicine

## 2024-07-24 ENCOUNTER — Other Ambulatory Visit: Payer: Self-pay | Admitting: Internal Medicine

## 2024-07-24 DIAGNOSIS — E279 Disorder of adrenal gland, unspecified: Secondary | ICD-10-CM

## 2024-07-30 DIAGNOSIS — E039 Hypothyroidism, unspecified: Secondary | ICD-10-CM | POA: Diagnosis not present

## 2024-07-30 DIAGNOSIS — M810 Age-related osteoporosis without current pathological fracture: Secondary | ICD-10-CM | POA: Diagnosis not present

## 2024-07-30 DIAGNOSIS — I10 Essential (primary) hypertension: Secondary | ICD-10-CM | POA: Diagnosis not present

## 2024-08-02 DIAGNOSIS — M810 Age-related osteoporosis without current pathological fracture: Secondary | ICD-10-CM | POA: Diagnosis not present

## 2024-08-02 DIAGNOSIS — Z23 Encounter for immunization: Secondary | ICD-10-CM | POA: Diagnosis not present

## 2024-08-02 DIAGNOSIS — R809 Proteinuria, unspecified: Secondary | ICD-10-CM | POA: Diagnosis not present

## 2024-08-02 DIAGNOSIS — Z0000 Encounter for general adult medical examination without abnormal findings: Secondary | ICD-10-CM | POA: Diagnosis not present

## 2024-08-02 DIAGNOSIS — L853 Xerosis cutis: Secondary | ICD-10-CM | POA: Diagnosis not present

## 2024-08-02 DIAGNOSIS — I2510 Atherosclerotic heart disease of native coronary artery without angina pectoris: Secondary | ICD-10-CM | POA: Diagnosis not present

## 2024-08-02 DIAGNOSIS — M109 Gout, unspecified: Secondary | ICD-10-CM | POA: Diagnosis not present

## 2024-08-02 DIAGNOSIS — I6529 Occlusion and stenosis of unspecified carotid artery: Secondary | ICD-10-CM | POA: Diagnosis not present

## 2024-08-02 DIAGNOSIS — R0982 Postnasal drip: Secondary | ICD-10-CM | POA: Diagnosis not present

## 2024-08-02 DIAGNOSIS — E279 Disorder of adrenal gland, unspecified: Secondary | ICD-10-CM | POA: Diagnosis not present

## 2024-08-02 DIAGNOSIS — I10 Essential (primary) hypertension: Secondary | ICD-10-CM | POA: Diagnosis not present

## 2024-08-02 DIAGNOSIS — E039 Hypothyroidism, unspecified: Secondary | ICD-10-CM | POA: Diagnosis not present

## 2024-08-22 DIAGNOSIS — R053 Chronic cough: Secondary | ICD-10-CM | POA: Diagnosis not present

## 2024-11-28 ENCOUNTER — Ambulatory Visit (HOSPITAL_COMMUNITY)
